# Patient Record
Sex: Male | Born: 1960 | Race: Black or African American | Hispanic: No | Smoking: Current every day smoker
Health system: Southern US, Community
[De-identification: ages and names within clinical notes are randomized; demographics above are authoritative.]

## PROBLEM LIST (undated history)

## (undated) DIAGNOSIS — M109 Gout, unspecified: Secondary | ICD-10-CM

## (undated) DIAGNOSIS — R7303 Prediabetes: Secondary | ICD-10-CM

## (undated) DIAGNOSIS — I10 Essential (primary) hypertension: Secondary | ICD-10-CM

## (undated) HISTORY — PX: COLONOSCOPY: SHX174

## (undated) HISTORY — PX: KNEE SURGERY: SHX244

---

## 2002-11-09 ENCOUNTER — Emergency Department (HOSPITAL_COMMUNITY): Admission: EM | Admit: 2002-11-09 | Discharge: 2002-11-09 | Payer: Self-pay | Admitting: Emergency Medicine

## 2003-08-09 ENCOUNTER — Encounter: Admission: RE | Admit: 2003-08-09 | Discharge: 2003-08-09 | Payer: Self-pay | Admitting: Internal Medicine

## 2003-09-01 ENCOUNTER — Encounter: Admission: RE | Admit: 2003-09-01 | Discharge: 2003-09-01 | Payer: Self-pay | Admitting: Internal Medicine

## 2004-02-06 ENCOUNTER — Emergency Department (HOSPITAL_COMMUNITY): Admission: EM | Admit: 2004-02-06 | Discharge: 2004-02-06 | Payer: Self-pay | Admitting: Emergency Medicine

## 2004-03-27 ENCOUNTER — Emergency Department (HOSPITAL_COMMUNITY): Admission: EM | Admit: 2004-03-27 | Discharge: 2004-03-27 | Payer: Self-pay | Admitting: Emergency Medicine

## 2006-04-15 ENCOUNTER — Emergency Department (HOSPITAL_COMMUNITY): Admission: EM | Admit: 2006-04-15 | Discharge: 2006-04-15 | Payer: Self-pay | Admitting: Emergency Medicine

## 2006-09-23 ENCOUNTER — Emergency Department (HOSPITAL_COMMUNITY): Admission: EM | Admit: 2006-09-23 | Discharge: 2006-09-23 | Payer: Self-pay | Admitting: Emergency Medicine

## 2007-03-11 ENCOUNTER — Encounter: Admission: RE | Admit: 2007-03-11 | Discharge: 2007-03-11 | Payer: Self-pay | Admitting: Occupational Medicine

## 2008-05-01 ENCOUNTER — Emergency Department (HOSPITAL_COMMUNITY): Admission: EM | Admit: 2008-05-01 | Discharge: 2008-05-01 | Payer: Self-pay | Admitting: Emergency Medicine

## 2008-09-15 ENCOUNTER — Ambulatory Visit: Payer: Self-pay | Admitting: Cardiology

## 2008-09-15 ENCOUNTER — Observation Stay (HOSPITAL_COMMUNITY): Admission: EM | Admit: 2008-09-15 | Discharge: 2008-09-16 | Payer: Self-pay | Admitting: Emergency Medicine

## 2008-09-27 ENCOUNTER — Emergency Department (HOSPITAL_COMMUNITY): Admission: EM | Admit: 2008-09-27 | Discharge: 2008-09-27 | Payer: Self-pay | Admitting: Emergency Medicine

## 2011-03-22 ENCOUNTER — Emergency Department (HOSPITAL_COMMUNITY): Payer: Self-pay

## 2011-03-22 ENCOUNTER — Observation Stay (HOSPITAL_COMMUNITY)
Admission: EM | Admit: 2011-03-22 | Discharge: 2011-03-23 | Disposition: A | Discharge status: Home / Self Care | Payer: Self-pay | Attending: Emergency Medicine | Admitting: Emergency Medicine

## 2011-03-22 DIAGNOSIS — I10 Essential (primary) hypertension: Secondary | ICD-10-CM | POA: Insufficient documentation

## 2011-03-22 DIAGNOSIS — R079 Chest pain, unspecified: Principal | ICD-10-CM | POA: Insufficient documentation

## 2011-03-22 LAB — POCT CARDIAC MARKERS
CKMB, poc: <1 ng/mL — ABNORMAL LOW (ref 1.0–8.0)
Myoglobin, poc: 40.1 ng/mL (ref 12–200)
Myoglobin, poc: 52.1 ng/mL (ref 12–200)
Myoglobin, poc: 66.8 ng/mL (ref 12–200)
Troponin i, poc: <0.05 ng/mL (ref 0.00–0.09)

## 2011-03-22 LAB — CBC
HCT: 44.3 % (ref 39.0–52.0)
MCH: 28.8 pg (ref 26.0–34.0)
MCHC: 33.4 g/dL (ref 30.0–36.0)
MCV: 86.2 fL (ref 78.0–100.0)
RDW: 14.5 % (ref 11.5–15.5)

## 2011-03-22 LAB — DIFFERENTIAL
Basophils Absolute: 0 10*3/uL (ref 0.0–0.1)
Eosinophils Relative: 2 % (ref 0–5)
Lymphocytes Relative: 33 % (ref 12–46)
Monocytes Absolute: 0.5 10*3/uL (ref 0.1–1.0)
Monocytes Relative: 6 % (ref 3–12)

## 2011-03-22 LAB — POCT I-STAT, CHEM 8
Creatinine, Ser: 1 mg/dL (ref 0.4–1.5)
Glucose, Bld: 100 mg/dL — ABNORMAL HIGH (ref 70–99)
Hemoglobin: 16 g/dL (ref 13.0–17.0)
Potassium: 4.2 mEq/L (ref 3.5–5.1)

## 2011-03-23 DIAGNOSIS — R072 Precordial pain: Secondary | ICD-10-CM

## 2011-04-23 ENCOUNTER — Inpatient Hospital Stay (INDEPENDENT_AMBULATORY_CARE_PROVIDER_SITE_OTHER)
Admission: RE | Admit: 2011-04-23 | Discharge: 2011-04-23 | Disposition: A | Discharge status: Home / Self Care | Payer: Self-pay | Source: Ambulatory Visit

## 2011-04-23 DIAGNOSIS — I10 Essential (primary) hypertension: Secondary | ICD-10-CM

## 2011-04-23 DIAGNOSIS — Z760 Encounter for issue of repeat prescription: Secondary | ICD-10-CM

## 2011-05-08 NOTE — H&P (Signed)
NAME:  Devin [name], Devin [name] NO.:  [id number]   MEDICAL RECORD NO.:  [id number]          PATIENT TYPE:  INP   LOCATION:  3735                         FACILITY:  [facility]   PHYSICIAN:  Vania Rea, M.D. DATE OF BIRTH:  10-06-1961   DATE OF ADMISSION:  09/14/2008  DATE OF DISCHARGE:                              HISTORY & PHYSICAL   PRIMARY CARE PHYSICIAN:  Unassigned.   CHIEF COMPLAINT:  Chest pain.   HISTORY OF PRESENT ILLNESS:  This is a 50 year old African American  gentleman with no medical followup and who does abuse crack cocaine who  presented to the emergency room complaining of chest pain on and off for  the past day.  The patient says he last used crack cocaine about 3 days  ago and has never felt a pain like this before.  The pain is described  as about an 8/10, central, does not radiate.  It is associated with  nausea and vomiting x1, no diaphoresis.  No dizziness.  It is sometimes  aggravated by breathing.  He came to the emergency room and received  aspirin and sublingual nitroglycerin and said it did produce a slight  improvement in the pain.   PAST MEDICAL HISTORY:  None.   MEDICATIONS:  None.   ALLERGIES:  No known drug allergies.   SOCIAL HISTORY:  Says he smokes about 3 to 4 cigarettes per day.  He  drinks two tall cans of beer per day.  Denies marijuana use.  Smokes  crack cocaine.  He is a Administrator.   FAMILY HISTORY:  Significant for diabetes.   REVIEW OF SYSTEMS:  Other than noted above, a 10 point review of systems  is unremarkable.   PHYSICAL EXAMINATION:  GENERAL:  Medium build, middle aged [place]  American gentleman lying on the stretcher in no acute distress at this  time.  VITAL SIGNS:  His temperature is 96.7,  pulse 98, respiratory rate 21,  blood pressure 124/77.  He is saturating 100% on 2L.  HEENT:  His pupils are round and equal.  Mucous membranes are pink and  anicteric.  He is not dehydrated.  NECK:  No cervical  lymphadenopathy or thyromegaly.  No jugular venous  distention.  CHEST:  Is clear to auscultation bilaterally.  CARDIOVASCULAR SYSTEM:  Regular rhythm without murmur.  ABDOMEN:  Soft and nontender.  There are no masses.  He has normal  abdominal bowel sounds.  EXTREMITIES:  Are without edema.  He has 2+ pulses bilaterally.  CENTRAL NERVOUS SYSTEM:  Cranial nerves II-XII are grossly intact and he  has no focal neurological deficits.   LABORATORY DATA:  His CBC is completely normal.  His hemoglobin is 13.7.  His serum chemistry is likewise completely normal.  His urinalysis is  similarly completely  normal.  His urine drug screen is positive for  cocaine.  His cardiac enzymes show total CK elevated at 283.  His CK-MB  is normal at 1.8. His troponin is 0.01.  Beta type naturetic peptide is  nondetectable.  His chest x-ray shows a tortuous aorta but is  otherwise  unremarkable.   ASSESSMENT:  1. Chest pain associated with crack cocaine use.  2. Polysubstance abuse, alcohol and cocaine.   PLAN:  Will admit this gentleman on chest pain rule out protocol.  Will  give him benzodiazepines to prevent any type of withdrawal syndrome and  will consult cardiology for possible stress testing.      Vania Rea, M.D.  Electronically Signed     LC/MEDQ  D:  09/15/2008  T:  09/15/2008  Job:  [id number]

## 2011-05-08 NOTE — Discharge Summary (Signed)
NAME:  Devin [name], Devin [name] NO.:  [id number]   MEDICAL RECORD NO.:  [id number]          PATIENT TYPE:  INP   LOCATION:  3735                         FACILITY:  [facility]   PHYSICIAN:  Marcellus Scott, MD     DATE OF BIRTH:  02-Jun-1961   DATE OF ADMISSION:  09/14/2008  DATE OF DISCHARGE:  09/16/2008                               DISCHARGE SUMMARY   PRIMARY CARE PHYSICIAN:  Unassigned but will followup with [facility],  Dr. Tressia Danas.   DISCHARGE DIAGNOSIS:  1. Atypical chest pain.  MI ruled out.  Negative stress test.  2. Gastroesophageal reflux disease.  3. Substance abuse.  4. Alcohol use.  5. Tobacco abuse.  6. Dyslipidemia.   DISCHARGE MEDICATIONS:  1. Prilosec over the counter 20 mg tablet one p.o. daily.  2. Mylanta 30 to 60 ml p.o. q.4-6 hourly p.r.n.  3. Multi vitamins one p.o. daily.   PROCEDURES:  1. Nuclear medicine stress test done today.  Impression; (a) no      reversible ischemia or infarction; (b) normal wall motions; (c)      left ventricular ejection fraction 58%.  2. Chest x-ray on the 22nd of September.  Impression; tortuous aorta.      No infiltrate or congestive heart failure.   PERTINENT LABS:  Cardiac panel cycle times four negative.  TSH times two  normal.  2.117 and 2.464 lipid panel with cholesterol 163, triglycerides  540, HDL 49, LDL unable to calculate secondary to triglyceride greater  than 400. Hepatic panel only remarkable for albumin of 3.2. Urine drug  screen positive for cocaine. Urinalysis not indicative of UTI and  negative for protein.  Blood alcohol level of 237.  CBC is with  hemoglobin 13.7, hematocrit 90.2, white blood cell 8.4, platelets 260.   CONSULTATIONS:  1. Dr. Corinda Gubler cardiology.   HOSPITAL COURSE & PATIENT DISPOSITION:  Please refer to the history and  physical note for initial admission details. In summary Devin [name] is a  pleasant 50 year old African American male with no significant medical  history but  with a history of substance abuse (crack cocaine), tobacco  abuse and alcohol use who presented to the Emergency Department with on  and off chest pain for a day.  He had used crack cocaine three days  prior to admission. He presented with 8/10 central non-radiating chest  pain with associated nausea and vomiting. Sometimes the pain apparently  increased on breathing.  He was then evaluated in the Emergency Room,  provided aspirin and sublingual nitroglycerine with some relief in the  pain.  He was thereby admitted for further evaluation.   1. Atypical chest pain.  The patient was admitted to telemetry.      Cardiac enzymes were cycled and negative.  The patient did not have      any acute EKG changes.  His chest pain was suggestive of GI      etiology. He indicated that he has been having heartburn symptoms      for some time now and in the hospital too whenever he ate something  he would have worsening of his burning, lower substernal chest      pain. This seemed to be relieved with Mylanta.  He indicates that      since admission his chest pain is much better. He has been      counseled regarding abstinence from polysubstance abuse.  He had      some risk factors including his tobacco abuse, crack cocaine abuse      and possible dyslipidemia.  He does not have any family history of      coronary artery disease.  Stress test was discussed with  [facility] and he has this done and it was negative for ischemia.  2. Gastroesophageal reflux disease.  The patient's symptomatology is      quite suggestive of same.  He is being placed on proton pump      inhibitors.  Consider an outpatient EGD if deemed necessary.  3. Substance abuse/crack cocaine.  Patient has been counseled      regarding abstinence and he indicates he will give all these things      up.  4. Alcohol use.  The patient did not demonstrate any alcohol      withdrawal symptoms.  5. Tobacco abuse.  6.  Dyslipidemia.  The patient has markedly elevated triglyceride      levels. This is in the context of elevated alcohol levels.  Do      consider repeating his fasting lipids after he has been abstinent      from alcohol for a few days.   The patient did not have a primary medical doctor and hence social work  consult was obtained and the patient has an appointment to see Dr.  Tressia Danas  at [facility] on the 5th of October 2009 at 2:30 p.m. for  eligibility.      Marcellus Scott, MD  Electronically Signed     AH/MEDQ  D:  09/16/2008  T:  09/16/2008  Job:  [id number]   cc:   Melvern Banker

## 2011-09-12 ENCOUNTER — Inpatient Hospital Stay (INDEPENDENT_AMBULATORY_CARE_PROVIDER_SITE_OTHER)
Admission: RE | Admit: 2011-09-12 | Discharge: 2011-09-12 | Disposition: A | Discharge status: Home / Self Care | Payer: Self-pay | Source: Ambulatory Visit | Attending: Emergency Medicine | Admitting: Emergency Medicine

## 2011-09-12 DIAGNOSIS — I10 Essential (primary) hypertension: Secondary | ICD-10-CM

## 2011-09-24 LAB — LIPID PANEL
Cholesterol: 163
Cholesterol: 169
HDL: 46
HDL: 49
Total CHOL/HDL Ratio: 3.7
Triglycerides: 561 — ABNORMAL HIGH

## 2011-09-24 LAB — CBC
HCT: 40.8
MCHC: 34.5
MCV: 89.8
Platelets: 260
Platelets: 323
RDW: 14.6
RDW: 14.5
WBC: 8.4
WBC: 8

## 2011-09-24 LAB — POCT I-STAT, CHEM 8
Calcium, Ion: 1.05 — ABNORMAL LOW
Chloride: 108
Creatinine, Ser: 1.2
Glucose, Bld: 78
Glucose, Bld: 92
HCT: 42
HCT: 43
Hemoglobin: 14.3
Hemoglobin: 14.6
Sodium: 142
TCO2: 20

## 2011-09-24 LAB — DIFFERENTIAL
Basophils Absolute: 0.1
Basophils Relative: 0
Eosinophils Absolute: 0.1
Eosinophils Absolute: 0.1
Eosinophils Relative: 1
Eosinophils Relative: 1
Lymphocytes Relative: 45
Lymphs Abs: 3.8
Lymphs Abs: 4
Neutrophils Relative %: 45

## 2011-09-24 LAB — ETHANOL: Alcohol, Ethyl (B): 237 — ABNORMAL HIGH

## 2011-09-24 LAB — URINALYSIS, ROUTINE W REFLEX MICROSCOPIC
Bilirubin Urine: NEGATIVE
Glucose, UA: NEGATIVE
Glucose, UA: NEGATIVE
Hgb urine dipstick: NEGATIVE
Ketones, ur: NEGATIVE
Nitrite: NEGATIVE
Protein, ur: NEGATIVE
Specific Gravity, Urine: 1.021
pH: 6
pH: 6

## 2011-09-24 LAB — HEPATIC FUNCTION PANEL
ALT: 25
AST: 30
Albumin: 3.1 — ABNORMAL LOW
Albumin: 3.2 — ABNORMAL LOW
Alkaline Phosphatase: 43
Bilirubin, Direct: <0.1
Total Bilirubin: 0.9
Total Bilirubin: 0.9
Total Protein: 6.2

## 2011-09-24 LAB — POCT CARDIAC MARKERS
CKMB, poc: 1.3
Myoglobin, poc: 138

## 2011-09-24 LAB — TSH
TSH: 2.117
TSH: 2.464

## 2011-09-24 LAB — CARDIAC PANEL(CRET KIN+CKTOT+MB+TROPI)
Relative Index: 0.6
Relative Index: 0.7
Total CK: 159
Total CK: 181
Troponin I: 0.01
Troponin I: <0.01

## 2011-09-24 LAB — COMPREHENSIVE METABOLIC PANEL
ALT: 28
Alkaline Phosphatase: 52
BUN: 9
CO2: 23
Chloride: 106
Glucose, Bld: 78
Potassium: 4
Sodium: 137
Total Bilirubin: 1

## 2011-09-24 LAB — APTT: aPTT: 29

## 2011-09-24 LAB — RAPID URINE DRUG SCREEN, HOSP PERFORMED
Amphetamines: NOT DETECTED
Benzodiazepines: NOT DETECTED
Cocaine: POSITIVE — AB

## 2011-09-24 LAB — URINE CULTURE
Colony Count: NO GROWTH
Culture: NO GROWTH

## 2011-09-24 LAB — CK TOTAL AND CKMB (NOT AT ARMC): CK, MB: 1.8

## 2011-09-24 LAB — PROTIME-INR
INR: 1
Prothrombin Time: 13.5

## 2011-09-24 LAB — TROPONIN I: Troponin I: 0.01

## 2012-04-01 ENCOUNTER — Encounter (HOSPITAL_COMMUNITY): Payer: Self-pay | Admitting: Emergency Medicine

## 2012-04-01 ENCOUNTER — Emergency Department (INDEPENDENT_AMBULATORY_CARE_PROVIDER_SITE_OTHER)
Admission: EM | Admit: 2012-04-01 | Discharge: 2012-04-01 | Disposition: A | Discharge status: Nursing Home (Includes ICF) | Payer: Self-pay | Source: Home / Self Care

## 2012-04-01 ENCOUNTER — Emergency Department (HOSPITAL_COMMUNITY)
Admission: EM | Admit: 2012-04-01 | Discharge: 2012-04-01 | Disposition: A | Discharge status: Home / Self Care | Payer: Self-pay | Attending: Emergency Medicine | Admitting: Emergency Medicine

## 2012-04-01 ENCOUNTER — Encounter (HOSPITAL_COMMUNITY): Payer: Self-pay | Admitting: *Deleted

## 2012-04-01 DIAGNOSIS — Z9889 Other specified postprocedural states: Secondary | ICD-10-CM | POA: Insufficient documentation

## 2012-04-01 DIAGNOSIS — R51 Headache: Secondary | ICD-10-CM | POA: Insufficient documentation

## 2012-04-01 DIAGNOSIS — Z79899 Other long term (current) drug therapy: Secondary | ICD-10-CM | POA: Insufficient documentation

## 2012-04-01 DIAGNOSIS — F17200 Nicotine dependence, unspecified, uncomplicated: Secondary | ICD-10-CM | POA: Insufficient documentation

## 2012-04-01 DIAGNOSIS — I10 Essential (primary) hypertension: Secondary | ICD-10-CM

## 2012-04-01 HISTORY — DX: Essential (primary) hypertension: I10

## 2012-04-01 MED ORDER — LISINOPRIL-HYDROCHLOROTHIAZIDE 10-12.5 MG PO TABS: 1.0000 | ORAL_TABLET | Freq: Every day | ORAL | Status: DC

## 2012-04-01 MED ORDER — KETOROLAC TROMETHAMINE 15 MG/ML IJ SOLN
15.0000 mg | Freq: Once | INTRAMUSCULAR | Status: AC
  Administered 2012-04-01: 15 mg via INTRAMUSCULAR
  Filled 2012-04-01: qty 1

## 2012-04-01 MED ORDER — OXYCODONE-ACETAMINOPHEN 5-325 MG PO TABS
2.0000 | ORAL_TABLET | Freq: Once | ORAL | Status: AC
  Administered 2012-04-01: 2 via ORAL
  Filled 2012-04-01: qty 2

## 2012-04-01 MED ORDER — HYDROCODONE-ACETAMINOPHEN 5-325 MG PO TABS
1.0000 | ORAL_TABLET | Freq: Once | ORAL | Status: AC
  Administered 2012-04-01: 1 via ORAL

## 2012-04-01 MED ORDER — HYDROCODONE-ACETAMINOPHEN 5-325 MG PO TABS
ORAL_TABLET | ORAL | Status: AC
  Filled 2012-04-01: qty 1

## 2012-04-01 NOTE — ED Notes (Signed)
Patient was sent from [facility] for further eval of headache and htn.  He states he has had sx for 3 days.   Patient has throbbing headache when he stands up

## 2012-04-01 NOTE — ED Provider Notes (Signed)
Medical screening examination/treatment/procedure(s) were performed by non-physician practitioner and as supervising physician I was immediately available for consultation/collaboration.  LANEY,RONNIE   Wadie Liew B Laney, MD 04/01/12 1742 

## 2012-04-01 NOTE — ED Provider Notes (Signed)
History    Devin [name] with HA. Referred from UC for further eval. Gradual onset about 3d ago. Cant remember what was doing at onset. Denies trauma. Constant and without radiation. Throbs. No appreciable exacerbating or relieving factors. No fever or chills. Denies hx of similar HA. No n/v. No acute visual changes. No numbness, tingling or loss of strength.    CSN: [id number]  Arrival date & time 04/01/12  1518   First MD Initiated Contact with Patient 04/01/12 1822      Chief Complaint  Patient presents with  . Headache  . Hypertension    (Consider location/radiation/quality/duration/timing/severity/associated sxs/prior treatment) HPI  Past Medical History  Diagnosis Date  . Hypertension     Past Surgical History  Procedure Date  . Knee surgery     No family history on file.  History  Substance Use Topics  . Smoking status: Current Everyday Smoker  . Smokeless tobacco: Not on file  . Alcohol Use: Yes      Review of Systems   Review of symptoms negative unless otherwise noted in HPI.   Allergies  Review of patient's allergies indicates no known allergies.  Home Medications   Current Outpatient Rx  Name Route Sig Dispense Refill  . LISINOPRIL-HYDROCHLOROTHIAZIDE 10-12.5 MG PO TABS Oral Take 1 tablet by mouth daily.      BP 156/107  Pulse 88  Temp(Src) 98.3 F (36.8 C) (Oral)  Resp 16  Ht 6\' 2"  (1.88 m)  Wt 205 lb (92.987 kg)  BMI 26.32 kg/m2  SpO2 97%  Physical Exam  Nursing note and vitals reviewed. Constitutional: He is oriented to person, place, and time. He appears well-developed and well-nourished. No distress.  HENT:  Head: Normocephalic and atraumatic.  Right Ear: External ear normal.  Left Ear: External ear normal.  Eyes: Conjunctivae are normal. Pupils are equal, round, and reactive to light. Right eye exhibits no discharge. Left eye exhibits no discharge.  Neck: Normal range of motion. Neck supple.  Cardiovascular: Normal rate, regular rhythm  and normal heart sounds.  Exam reveals no gallop and no friction rub.   No murmur heard. Pulmonary/Chest: Effort normal and breath sounds normal. No respiratory distress.  Abdominal: Soft. He exhibits no distension. There is no tenderness.  Musculoskeletal: He exhibits no edema and no tenderness.  Lymphadenopathy:    He has no cervical adenopathy.  Neurological: He is alert and oriented to person, place, and time. No cranial nerve deficit. He exhibits normal muscle tone. Coordination normal.       Good finger to nose and heel to shin testing b/l . steady gait.  Skin: Skin is warm and dry. He is not diaphoretic.  Psychiatric: He has a normal mood and affect. His behavior is normal. Thought content normal.    ED Course  Procedures (including critical care time)  Labs Reviewed - No data to display No results found.   1. Headache   2. Hypertension       MDM  Devin [name] with HA. Suspect primary HA. Consider emergent secondary causes such as bleed, infectious or mass but doubt. There is no history of trauma. Pt has a nonfocal neurological exam. Afebrile and neck supple. No use of blood thinning medication. Consider ocular etiology such as acute angle closure glaucoma but doubt. Pt is hypertensive but doubt hypertensive emergency. Pt denies acute change in visual acuity and eye exam unremarkable. Doubt temporal arteritis given age, no temporal tenderness and temporal artery pulsations palpable. Doubt CO poisoning. No contacts with similar  symptoms. Doubt venous thrombosis. Doubt carotid or vertebral arteries dissection. Symptoms improved with meds. Feel that can be safely discharged, but strict return precautions discussed. Outpt fu.         Raeford Razor, MD 04/10/12 [phone]

## 2012-04-01 NOTE — ED Notes (Signed)
The pt  Was sent here from [facility] with a headache for several days.  He was given a ibu at ucc at 1300. His headache is not quite as bad now.  Alert  Steady gait

## 2012-04-01 NOTE — ED Notes (Signed)
Patient given copy of discharge paperwork; went over discharge instructions with patient.  Patient instructed to take blood pressure medication as directed, to follow up with a primary care physician or use one of the referrals, and to return to the ED for new, worsening, or concerning symptoms.

## 2012-04-01 NOTE — ED Notes (Signed)
The pt has not taken his bp med for over a month

## 2012-04-01 NOTE — ED Notes (Signed)
Received bedside report from [place], [place].  Patient currently resting quietly in bed; no respiratory or acute distress noted.  Patient updated on plan of care; informed patient that we are currently waiting on Toradol from pharmacy.  Patient has no other questions or concerns; will continue to monitor.

## 2012-04-01 NOTE — Discharge Instructions (Signed)
General Headache, Without Cause A general headache has no specific cause. These headaches are not life-threatening. They will not lead to other types of headaches. HOME CARE   Make and keep follow-up visits with your doctor.   Only take medicine as told by your doctor.   Try to relax, get a massage, or use your thoughts to control your body (biofeedback).   Apply cold or heat to the head and neck. Apply 3 or 4 times a day or as needed.  Finding out the results of your test Ask when your test results will be ready. Make sure you get your test results. GET HELP RIGHT AWAY IF:   You have problems with medicine.   Your medicine does not help relieve pain.   Your headache changes or becomes worse.   You feel sick to your stomach (nauseous) or throw up (vomit).   You have a temperature by mouth above 102 F (38.9 C), not controlled by medicine.   Your have a stiff neck.   You have vision loss.   You have muscle weakness.   You lose control of your muscles.   You lose balance or have trouble walking.   You feel like you are going to pass out (faint).  MAKE SURE YOU:   Understand these instructions.   Will watch this condition.  Will get help right away if you are not doing well or get worse. Arterial Hypertension Arterial hypertension (high blood pressure) is a condition of elevated pressure in your blood vessels. Hypertension over a long period of time is a risk factor for strokes, heart attacks, and heart failure. It is also the leading cause of kidney (renal) failure.  CAUSES  In Adults -- Over 90% of all hypertension has no known cause. This is called essential or primary hypertension. In the other 10% of people with hypertension, the increase in blood pressure is caused by another disorder. This is called secondary hypertension. Important causes of secondary hypertension are:  Heavy alcohol use.  Obstructive sleep apnea.  Hyperaldosterosim (Conn's syndrome).  Steroid  use.  Chronic kidney failure.  Hyperparathyroidism.  Medications.  Renal artery stenosis.  Pheochromocytoma.  Cushing's disease.  Coarctation of the aorta.  Scleroderma renal crisis.  Licorice (in excessive amounts).  Drugs (cocaine, methamphetamine).  Your caregiver can explain any items above that apply to you. In Children -- Secondary hypertension is more common and should always be considered.  Pregnancy -- Few women of childbearing age have high blood pressure. However, up to 10% of them develop hypertension of pregnancy. Generally, this will not harm the woman. It may be a sign of 3 complications of pregnancy: preeclampsia, HELLP syndrome, and eclampsia. Follow up and control with medication is necessary.  SYMPTOMS  This condition normally does not produce any noticeable symptoms. It is usually found during a routine exam.  Malignant hypertension is a late problem of high blood pressure. It may have the following symptoms:  Headaches.  Blurred vision.  End-organ damage (this means your kidneys, heart, lungs, and other organs are being damaged).  Stressful situations can increase the blood pressure. If a person with normal blood pressure has their blood pressure go up while being seen by their caregiver, this is often termed "white coat hypertension." Its importance is not known. It may be related with eventually developing hypertension or complications of hypertension.  Hypertension is often confused with mental tension, stress, and anxiety.  DIAGNOSIS  The diagnosis is made by 3 separate blood pressure  measurements. They are taken at least 1 week apart from each other. If there is organ damage from hypertension, the diagnosis may be made without repeat measurements. Hypertension is usually identified by having blood pressure readings: Above 140/90 mmHg measured in both arms, at 3 separate times, over a couple weeks.  Over 130/80 mmHg should be considered a risk factor and may require  treatment in patients with diabetes.  Blood pressure readings over 120/80 mmHg are called "pre-hypertension" even in non-diabetic patients. To get a true blood pressure measurement, use the following guidelines. Be aware of the factors that can alter blood pressure readings. Take measurements at least 1 hour after caffeine.  Take measurements 30 minutes after smoking and without any stress. This is another reason to quit smoking - it raises your blood pressure.  Use a proper cuff size. Ask your caregiver if you are not sure about your cuff size.  Most home blood pressure cuffs are automatic. They will measure systolic and diastolic pressures. The systolic pressure is the pressure reading at the start of sounds. Diastolic pressure is the pressure at which the sounds disappear. If you are elderly, measure pressures in multiple postures. Try sitting, lying or standing.  Sit at rest for a minimum of 5 minutes before taking measurements.  You should not be on any medications like decongestants. These are found in many cold medications.  Record your blood pressure readings and review them with your caregiver.  If you have hypertension: Your caregiver may do tests to be sure you do not have secondary hypertension (see "causes" above).  Your caregiver may also look for signs of metabolic syndrome. This is also called Syndrome X or Insulin Resistance Syndrome. You may have this syndrome if you have type 2 diabetes, abdominal obesity, and abnormal blood lipids in addition to hypertension.  Your caregiver will take your medical and family history and perform a physical exam.  Diagnostic tests may include blood tests (for glucose, cholesterol, potassium, and kidney function), a urinalysis, or an EKG. Other tests may also be necessary depending on your condition.  PREVENTION  There are important lifestyle issues that you can adopt to reduce your chance of developing hypertension: Maintain a normal weight.    Limit the amount of salt (sodium) in your diet.  Exercise often.  Limit alcohol intake.  Get enough potassium in your diet. Discuss specific advice with your caregiver.  Follow a DASH diet (dietary approaches to stop hypertension). This diet is rich in fruits, vegetables, and low-fat dairy products, and avoids certain fats.  PROGNOSIS  Essential hypertension cannot be cured. Lifestyle changes and medical treatment can lower blood pressure and reduce complications. The prognosis of secondary hypertension depends on the underlying cause. Many people whose hypertension is controlled with medicine or lifestyle changes can live a normal, healthy life.  RISKS AND COMPLICATIONS  While high blood pressure alone is not an illness, it often requires treatment due to its short- and long-term effects on many organs. Hypertension increases your risk for: CVAs or strokes (cerebrovascular accident).  Heart failure due to chronically high blood pressure (hypertensive cardiomyopathy).  Heart attack (myocardial infarction).  Damage to the retina (hypertensive retinopathy).  Kidney failure (hypertensive nephropathy).  Your caregiver can explain list items above that apply to you. Treatment of hypertension can significantly reduce the risk of complications. TREATMENT  For overweight patients, weight loss and regular exercise are recommended. Physical fitness lowers blood pressure.  Mild hypertension is usually treated with diet and exercise. A  diet rich in fruits and vegetables, fat-free dairy products, and foods low in fat and salt (sodium) can help lower blood pressure. Decreasing salt intake decreases blood pressure in a 1/3 of people.  Stop smoking if you are a smoker.  The steps above are highly effective in reducing blood pressure. While these actions are easy to suggest, they are difficult to achieve. Most patients with moderate or severe hypertension end up requiring medications to bring their blood  pressure down to a normal level. There are several classes of medications for treatment. Blood pressure pills (antihypertensives) will lower blood pressure by their different actions. Lowering the blood pressure by 10 mmHg may decrease the risk of complications by as much as 25%. The goal of treatment is effective blood pressure control. This will reduce your risk for complications. Your caregiver will help you determine the best treatment for you according to your lifestyle. What is excellent treatment for one person, may not be for you. HOME CARE INSTRUCTIONS  Do not smoke.  Follow the lifestyle changes outlined in the "Prevention" section.  If you are on medications, follow the directions carefully. Blood pressure medications must be taken as prescribed. Skipping doses reduces their benefit. It also puts you at risk for problems.  Follow up with your caregiver, as directed.  If you are asked to monitor your blood pressure at home, follow the guidelines in the "Diagnosis" section above.  SEEK MEDICAL CARE IF:  You think you are having medication side effects.  You have recurrent headaches or lightheadedness.  You have swelling in your ankles.  You have trouble with your vision.  SEEK IMMEDIATE MEDICAL CARE IF:  You have sudden onset of chest pain or pressure, difficulty breathing, or other symptoms of a heart attack.  You have a severe headache.  You have symptoms of a stroke (such as sudden weakness, difficulty speaking, difficulty walking).  MAKE SURE YOU:  Understand these instructions.  Will watch your condition.  Will get help right away if you are not doing well or get worse.  Document Released: 12/10/2005 Document Revised: 11/29/2011 Document Reviewed: 07/10/2007  [facility] Patient Information 2012 [place], [place].  RESOURCE GUIDE  Dental Problems  Patients with Medicaid: [facility] [phone] [address]                                            [phone] [address] Phone:  [phone]                                                  Phone:  [phone]  If unable to pay or uninsured, contact:  [facility] or [facility]. to become qualified for the adult dental clinic.  Chronic Pain Problems Contact [facility]  [phone] Patients need to be referred by their primary care doctor.  Insufficient Money for Medicine Contact [employer]:  call "211" or [facility] [phone].  No Primary Care Doctor Call [facility]  4174032284 Other agencies that provide inexpensive medical care    [facility]  [phone]  [facility]  [phone]    [facility]  [phone]    [facility]  [phone]    [facility]  [phone]    [facility]  [phone]  Psychological Services [facility] Behavioral Health  [phone] [facility]  [phone] [facility] Mental Health   [phone] (emergency services [phone])  Substance Abuse Resources [facility]  [phone] [facility] [phone] [facility] [phone] Floydene Flock [phone] Residential & Outpatient Substance Abuse Program  [phone]  Abuse/Neglect [facility] Child Abuse Hotline 786-680-1651 [facility] Child Abuse Hotline [phone] (After Hours)  Emergency Shelter [facility] [phone]  Maternity Homes [facility] [place] [facility] [phone] [facility] [phone]  MRSA Hotline #:   872-653-2143    [facility] Resources  [facility] [place]     [employer]                          [facility] Dept. [address] [place]                       [address]      [address]  Blondell Reveal Phone:  [phone]                                    Phone:  [phone]                 Phone:  [phone]  [facility] Mental Health Phone:  [phone]  [facility] Child Abuse Hotline [phone] [phone] (After Hours)   Document Released: 09/18/2008 Document Revised: 11/29/2011 Document Reviewed: 09/18/2008 [facility] Patient Information 2012 [place], [place].

## 2012-04-01 NOTE — ED Notes (Signed)
Patient has a headache and headache started 3 days ago, pain worse with standing.  Reports dizziness with standing.   Ran out of blood pressure medicine approx one month ago.

## 2012-04-01 NOTE — ED Provider Notes (Signed)
History     CSN: [id number]  Arrival date & time 04/01/12  1138   None     Chief Complaint  Patient presents with  . Headache    (Consider location/radiation/quality/duration/timing/severity/associated sxs/prior treatment) HPI Comments: Patient presents today with complaints of a right-sided throbbing headache. Headache began 3 days ago. It is worse with standing. He also develops dizziness when he is standing. He denies nausea, vomiting, visual changes, photophobia, phonophobia, numbness or tingling. He also denies trauma. He states he has never had a headache like this previously. He has not taken anything for his discomfort. He has a history of hypertension and has been out of his blood pressure medication for approximately one month.   Past Medical History  Diagnosis Date  . Hypertension     Past Surgical History  Procedure Date  . Knee surgery     History reviewed. No pertinent family history.  History  Substance Use Topics  . Smoking status: Current Everyday Smoker  . Smokeless tobacco: Not on file  . Alcohol Use: Yes      Review of Systems  Constitutional: Negative for fever, chills and fatigue.  HENT: Negative for ear pain, congestion, sore throat, rhinorrhea, sneezing, postnasal drip and sinus pressure.   Respiratory: Negative for cough.   Cardiovascular: Negative for chest pain.  Gastrointestinal: Negative for nausea and vomiting.  Neurological: Positive for dizziness and headaches. Negative for weakness and numbness.    Allergies  Review of patient's allergies indicates no known allergies.  Home Medications   Current Outpatient Rx  Name Route Sig Dispense Refill  . OVER THE COUNTER MEDICATION  Cannot remember name of blood pressure medicine, says prescribed here.  Out of medicine for a month.  09/2012record: lisinopril-hctz 10-12.5mg  Qday      BP 144/94  Pulse 94  Temp(Src) 98.3 F (36.8 C) (Oral)  Resp 12  SpO2 100%  Physical Exam  Nursing  note and vitals reviewed. Constitutional: He appears well-developed and well-nourished. No distress.  HENT:  Head: Normocephalic and atraumatic.  Right Ear: Tympanic membrane, external ear and ear canal normal.  Left Ear: Tympanic membrane, external ear and ear canal normal.  Nose: Nose normal.  Mouth/Throat: Uvula is midline, oropharynx is clear and moist and mucous membranes are normal. No oropharyngeal exudate, posterior oropharyngeal edema or posterior oropharyngeal erythema.  Eyes: Conjunctivae, EOM and lids are normal. Pupils are equal, round, and reactive to light.  Fundoscopic exam:      The right eye shows no hemorrhage and no papilledema.       The left eye shows no hemorrhage and no papilledema.  Neck: Neck supple.  Cardiovascular: Normal rate, regular rhythm and normal heart sounds.   Pulmonary/Chest: Effort normal and breath sounds normal. No respiratory distress.  Lymphadenopathy:    He has no cervical adenopathy.  Neurological: He is alert. He has normal strength. No cranial nerve deficit.  Reflex Scores:      Bicep reflexes are 2+ on the right side and 2+ on the left side. Skin: Skin is warm and dry.  Psychiatric: He has a normal mood and affect.    ED Course  Procedures (including critical care time)  Labs Reviewed - No data to display No results found.   1. Headache   2. Hypertension       MDM  Pt transferred to [facility], [place] 04/01/12 1427

## 2013-02-09 ENCOUNTER — Emergency Department (HOSPITAL_COMMUNITY)
Admission: EM | Admit: 2013-02-09 | Discharge: 2013-02-09 | Discharge status: Against Medical Advice | Payer: Self-pay | Attending: Emergency Medicine | Admitting: Emergency Medicine

## 2013-02-09 ENCOUNTER — Other Ambulatory Visit: Payer: Self-pay

## 2013-02-09 ENCOUNTER — Emergency Department (HOSPITAL_COMMUNITY): Payer: Self-pay

## 2013-02-09 ENCOUNTER — Encounter (HOSPITAL_COMMUNITY): Payer: Self-pay | Admitting: Emergency Medicine

## 2013-02-09 DIAGNOSIS — R0602 Shortness of breath: Secondary | ICD-10-CM | POA: Insufficient documentation

## 2013-02-09 DIAGNOSIS — Z79899 Other long term (current) drug therapy: Secondary | ICD-10-CM | POA: Insufficient documentation

## 2013-02-09 DIAGNOSIS — R079 Chest pain, unspecified: Secondary | ICD-10-CM

## 2013-02-09 DIAGNOSIS — I10 Essential (primary) hypertension: Secondary | ICD-10-CM | POA: Insufficient documentation

## 2013-02-09 DIAGNOSIS — R0789 Other chest pain: Secondary | ICD-10-CM | POA: Insufficient documentation

## 2013-02-09 DIAGNOSIS — F17200 Nicotine dependence, unspecified, uncomplicated: Secondary | ICD-10-CM | POA: Insufficient documentation

## 2013-02-09 LAB — CBC
HCT: 43.9 % (ref 39.0–52.0)
MCH: 28.5 pg (ref 26.0–34.0)
MCV: 86.8 fL (ref 78.0–100.0)
Platelets: 250 10*3/uL (ref 150–400)
RBC: 5.06 MIL/uL (ref 4.22–5.81)
RDW: 14.8 % (ref 11.5–15.5)
WBC: 7.6 10*3/uL (ref 4.0–10.5)

## 2013-02-09 LAB — COMPREHENSIVE METABOLIC PANEL
AST: 22 U/L (ref 0–37)
BUN: 11 mg/dL (ref 6–23)
CO2: 26 mEq/L (ref 19–32)
Calcium: 9.3 mg/dL (ref 8.4–10.5)
Chloride: 106 mEq/L (ref 96–112)
Creatinine, Ser: 0.81 mg/dL (ref 0.50–1.35)
GFR calc non Af Amer: >90 mL/min (ref >90)
Total Bilirubin: 0.2 mg/dL — ABNORMAL LOW (ref 0.3–1.2)

## 2013-02-09 LAB — RAPID URINE DRUG SCREEN, HOSP PERFORMED
Benzodiazepines: NOT DETECTED
Cocaine: NOT DETECTED
Opiates: NOT DETECTED

## 2013-02-09 MED ORDER — LISINOPRIL 20 MG PO TABS
20.0000 mg | ORAL_TABLET | Freq: Once | ORAL | Status: AC
  Administered 2013-02-09: 20 mg via ORAL
  Filled 2013-02-09: qty 1

## 2013-02-09 MED ORDER — HYDROCODONE-ACETAMINOPHEN 5-325 MG PO TABS
2.0000 | ORAL_TABLET | Freq: Once | ORAL | Status: AC
  Administered 2013-02-09: 2 via ORAL
  Filled 2013-02-09: qty 2

## 2013-02-09 MED ORDER — FAMOTIDINE 20 MG PO TABS
20.0000 mg | ORAL_TABLET | Freq: Once | ORAL | Status: AC
  Administered 2013-02-09: 20 mg via ORAL
  Filled 2013-02-09: qty 1

## 2013-02-09 MED ORDER — HYDROCHLOROTHIAZIDE 12.5 MG PO CAPS
12.5000 mg | ORAL_CAPSULE | Freq: Once | ORAL | Status: AC
  Administered 2013-02-09: 12.5 mg via ORAL
  Filled 2013-02-09: qty 1

## 2013-02-09 MED ORDER — LISINOPRIL-HYDROCHLOROTHIAZIDE 20-12.5 MG PO TABS: 1.0000 | ORAL_TABLET | Freq: Every day | ORAL | Status: DC

## 2013-02-09 MED ORDER — GI COCKTAIL ~~LOC~~
30.0000 mL | Freq: Once | ORAL | Status: AC
  Administered 2013-02-09: 30 mL via ORAL
  Filled 2013-02-09: qty 30

## 2013-02-09 NOTE — ED Notes (Signed)
Pt states he is unable to wait any longer and have any more labs drawn. Dr. Arizona Constable aware. Pt medicated for HTN. Pt will sign out AMA.

## 2013-02-09 NOTE — ED Notes (Signed)
Dr. Denton Lank at bedside speaking with pt. Pt states he feels better and wants to leave AMA.

## 2013-02-09 NOTE — Progress Notes (Signed)
During [facility] ED visit referral to [facility] for community care liaison who spoke with pt to confirm no pcp, no coverage and [place] resident She discussed and provided written information for list of self pay [place] pcps and affordable care act marketplace enrollment information   

## 2013-02-09 NOTE — ED Provider Notes (Addendum)
History     CSN: [id number]  Arrival date & time 02/09/13  1202   First MD Initiated Contact with Patient 02/09/13 1206      Chief Complaint  Patient presents with  . Chest Pain  . Shortness of Breath    (Consider location/radiation/quality/duration/timing/severity/associated sxs/prior treatment) Patient is a 52 y.o. male presenting with chest pain and shortness of breath. The history is provided by the patient.  Chest Pain Associated symptoms: shortness of breath   Associated symptoms: no abdominal pain, no back pain, no cough, no fever and no headache   Shortness of Breath Associated symptoms: chest pain   Associated symptoms: no abdominal pain, no cough, no fever, no headaches, no neck pain and no rash   pt c/o mid cp onset today, constant, dull, non radiating. No associated nv, diaphoresis or sob. Pain is not pleuritic. At rest. No relation to position, activity or exertion. Denies cough or uri c/o. No hx gerd. Denies chest wall injury or strain. Smoker. Denies leg pain or swelling. No recent immobility, trauma, travel or surgery. No hx cad or family hx cad. No hx dvt or pe.      Past Medical History  Diagnosis Date  . Hypertension     Past Surgical History  Procedure Laterality Date  . Knee surgery      No family history on file.  History  Substance Use Topics  . Smoking status: Current Every Day Smoker  . Smokeless tobacco: Not on file  . Alcohol Use: Yes      Review of Systems  Constitutional: Negative for fever.  HENT: Negative for neck pain.   Eyes: Negative for redness.  Respiratory: Positive for shortness of breath. Negative for cough.   Cardiovascular: Positive for chest pain. Negative for leg swelling.  Gastrointestinal: Negative for abdominal pain.  Genitourinary: Negative for flank pain.  Musculoskeletal: Negative for back pain.  Skin: Negative for rash.  Neurological: Negative for headaches.  Hematological: Does not bruise/bleed easily.   Psychiatric/Behavioral: Negative for confusion.    Allergies  Review of patient's allergies indicates no known allergies.  Home Medications   Current Outpatient Rx  Name  Route  Sig  Dispense  Refill  . lisinopril-hydrochlorothiazide (PRINZIDE) 10-12.5 MG per tablet   Oral   Take 1 tablet by mouth daily.   30 tablet   3   . lisinopril-hydrochlorothiazide (PRINZIDE,ZESTORETIC) 10-12.5 MG per tablet   Oral   Take 1 tablet by mouth daily.           BP 166/112  Pulse 83  Temp(Src) 97.8 F (36.6 C)  SpO2 99%  Physical Exam  Nursing note and vitals reviewed. Constitutional: He is oriented to person, place, and time. He appears well-developed and well-nourished. No distress.  HENT:  Head: Atraumatic.  Mouth/Throat: Oropharynx is clear and moist.  Eyes: Conjunctivae are normal. No scleral icterus.  Neck: Neck supple. No tracheal deviation present.  Cardiovascular: Normal rate, regular rhythm, normal heart sounds and intact distal pulses.  Exam reveals no gallop and no friction rub.   No murmur heard. Pulmonary/Chest: Effort normal and breath sounds normal. No accessory muscle usage. No respiratory distress.  Abdominal: Soft. Bowel sounds are normal. He exhibits no distension and no mass. There is no tenderness. There is no rebound and no guarding.  Musculoskeletal: Normal range of motion. He exhibits no edema and no tenderness.  Neurological: He is alert and oriented to person, place, and time.  Skin: Skin is warm and dry.  Psychiatric: He has a normal mood and affect.    ED Course  Procedures (including critical care time)  Results for orders placed during the hospital encounter of 02/09/13  CBC      Result Value Range   WBC 7.6  4.0 - 10.5 K/uL   RBC 5.06  4.22 - 5.81 MIL/uL   Hemoglobin 14.4  13.0 - 17.0 g/dL   HCT 24.4  01.0 - 27.2 %   MCV 86.8  78.0 - 100.0 fL   MCH 28.5  26.0 - 34.0 pg   MCHC 32.8  30.0 - 36.0 g/dL   RDW 53.6  64.4 - 03.4 %   Platelets  250  150 - 400 K/uL  COMPREHENSIVE METABOLIC PANEL      Result Value Range   Sodium 141  135 - 145 mEq/L   Potassium 4.9  3.5 - 5.1 mEq/L   Chloride 106  96 - 112 mEq/L   CO2 26  19 - 32 mEq/L   Glucose, Bld 113 (*) 70 - 99 mg/dL   BUN 11  6 - 23 mg/dL   Creatinine, Ser 7.42  0.50 - 1.35 mg/dL   Calcium 9.3  8.4 - 59.5 mg/dL   Total Protein 7.1  6.0 - 8.3 g/dL   Albumin 3.7  3.5 - 5.2 g/dL   AST 22  0 - 37 U/L   ALT 23  0 - 53 U/L   Alkaline Phosphatase 66  39 - 117 U/L   Total Bilirubin 0.2 (*) 0.3 - 1.2 mg/dL   GFR calc non Af Amer >90  >90 mL/min   GFR calc Af Amer >90  >90 mL/min  URINE RAPID DRUG SCREEN (HOSP PERFORMED)      Result Value Range   Opiates NONE DETECTED  NONE DETECTED   Cocaine NONE DETECTED  NONE DETECTED   Benzodiazepines NONE DETECTED  NONE DETECTED   Amphetamines NONE DETECTED  NONE DETECTED   Tetrahydrocannabinol NONE DETECTED  NONE DETECTED   Barbiturates NONE DETECTED  NONE DETECTED  TROPONIN I      Result Value Range   Troponin I <0.30  <0.30 ng/mL   Dg Chest 2 View  02/09/2013  *RADIOLOGY REPORT*  Clinical Data: Chest pain and shortness of breath  CHEST - 2 VIEW  Comparison: March 22, 2011  Findings: The lungs are clear.  The heart size and pulmonary vascularity are normal.  No adenopathy.  No bone lesions.  No pneumothorax.  IMPRESSION: No abnormality noted.   Original Report Authenticated By: Bretta Bang, M.D.        MDM  Iv ns. Labs. Ecg. Cxr.  Pt given pepcid, gi cocktail and vicodin for symptom relief.   Date: 02/09/2013  Rate: 79  Rhythm: normal sinus rhythm  QRS Axis: left  Intervals: normal  ST/T Wave abnormalities: normal  Conduction Disutrbances:none  Narrative Interpretation:   Old EKG Reviewed: unchanged  Reviewed nursing notes and prior charts for additional history.    reviewed prior charts, prior stress test x 2 negative.   Symptoms present since am.  Will plan to get repeat troponin.  On recheck pt  asymptomatic w above meds.   If pt remains asymptomatic, second trop neg/not increasing, feel stable for d/c w close cardiology f/u 1-2 days - signed out to oncoming EDP, Hyacinth Meeker.    Pt states out of his lisinopril hctz x 1-2 months. States when was on tolerating very well, no adverse rxn and bp better controlled. Will give rx.  While waiting for completion workup, repeat trop, reassessment, pt left ED ama. Risks discussed w patient including heart attack, permanent heart damage, death - pt voices understanding, states he will follow up as instructed for cp and htn, but not willing to stay in ed for further evaluation or testing - left ED ama.      Suzi Roots, MD 02/09/13 1500  Suzi Roots, MD 02/09/13 1507  Suzi Roots, MD 02/09/13 [phone]

## 2013-02-09 NOTE — ED Notes (Signed)
Pt states he feels better after receiving medication for pain.

## 2013-02-09 NOTE — ED Notes (Signed)
States that sharp pain started in his left chest about 20-30 mins ago. Denies radiation. States that the pain was constant initially but now is intermittent.

## 2013-04-02 ENCOUNTER — Encounter (HOSPITAL_COMMUNITY): Payer: Self-pay | Admitting: Emergency Medicine

## 2013-04-02 ENCOUNTER — Emergency Department (HOSPITAL_COMMUNITY)
Admission: EM | Admit: 2013-04-02 | Discharge: 2013-04-02 | Disposition: A | Discharge status: Home / Self Care | Payer: Self-pay | Attending: Emergency Medicine | Admitting: Emergency Medicine

## 2013-04-02 ENCOUNTER — Emergency Department (HOSPITAL_COMMUNITY): Payer: Self-pay

## 2013-04-02 DIAGNOSIS — F17200 Nicotine dependence, unspecified, uncomplicated: Secondary | ICD-10-CM | POA: Insufficient documentation

## 2013-04-02 DIAGNOSIS — Z79899 Other long term (current) drug therapy: Secondary | ICD-10-CM | POA: Insufficient documentation

## 2013-04-02 DIAGNOSIS — I10 Essential (primary) hypertension: Secondary | ICD-10-CM | POA: Insufficient documentation

## 2013-04-02 DIAGNOSIS — M722 Plantar fascial fibromatosis: Secondary | ICD-10-CM | POA: Insufficient documentation

## 2013-04-02 MED ORDER — IBUPROFEN 400 MG PO TABS
800.0000 mg | ORAL_TABLET | Freq: Once | ORAL | Status: AC
  Administered 2013-04-02: 800 mg via ORAL
  Filled 2013-04-02: qty 2

## 2013-04-02 MED ORDER — OXYCODONE-ACETAMINOPHEN 5-325 MG PO TABS: ORAL_TABLET | ORAL | Status: DC

## 2013-04-02 MED ORDER — LISINOPRIL-HYDROCHLOROTHIAZIDE 20-12.5 MG PO TABS: 1.0000 | ORAL_TABLET | Freq: Every day | ORAL | Status: DC

## 2013-04-02 MED ORDER — OXYCODONE-ACETAMINOPHEN 5-325 MG PO TABS
2.0000 | ORAL_TABLET | Freq: Once | ORAL | Status: AC
  Administered 2013-04-02: 2 via ORAL
  Filled 2013-04-02: qty 2

## 2013-04-02 NOTE — ED Notes (Signed)
Pt c/o right foot pain x weeks worse this am with some swelling; pt denies obvious injury

## 2013-04-02 NOTE — ED Notes (Signed)
Pt reports right ankle pain beginning 3 days ago.  Pt states he woke this morning and pain so intense he couldn't walk on right foot.  Denies injury.  Pt states he took advil yesterday without relief.  Pt alert oriented X4

## 2013-04-02 NOTE — ED Provider Notes (Signed)
History     CSN: [id number]  Arrival date & time 04/02/13  4098   First MD Initiated Contact with Patient 04/02/13 [phone]      Chief Complaint  Patient presents with  . Foot Pain    (Consider location/radiation/quality/duration/timing/severity/associated sxs/prior treatment) HPI  Devin [name] is a 52 y.o. male complaining of exacerbation of chronic right foot pain over the last 2 days. Patient denies injury, states that the pain is severe, 9/10 it is exacerbated by weightbearing. Patient states the pain is much worse in the a.m. on taking his initial first steps. Pain is located at the heel and radiates towards the toes. Patient states that he has been out of his hypertension medication for the last 3 days.  Past Medical History  Diagnosis Date  . Hypertension     Past Surgical History  Procedure Laterality Date  . Knee surgery      History reviewed. No pertinent family history.  History  Substance Use Topics  . Smoking status: Current Every Day Smoker  . Smokeless tobacco: Not on file  . Alcohol Use: Yes      Review of Systems  Constitutional: Negative for fever.  Respiratory: Negative for shortness of breath.   Cardiovascular: Negative for chest pain.  Gastrointestinal: Negative for nausea, vomiting, abdominal pain and diarrhea.  All other systems reviewed and are negative.    Allergies  Review of patient's allergies indicates no known allergies.  Home Medications   Current Outpatient Rx  Name  Route  Sig  Dispense  Refill  . ibuprofen (ADVIL,MOTRIN) 200 MG tablet   Oral   Take 200 mg by mouth daily as needed for pain. For pain         . lisinopril-hydrochlorothiazide (PRINZIDE,ZESTORETIC) 20-12.5 MG per tablet   Oral   Take 1 tablet by mouth daily.   30 tablet   0   . lisinopril-hydrochlorothiazide (PRINZIDE) 20-12.5 MG per tablet   Oral   Take 1 tablet by mouth daily.   30 tablet   2   . oxyCODONE-acetaminophen (PERCOCET/ROXICET) 5-325 MG  per tablet      1 to 2 tabs PO q6hrs  PRN for pain   15 tablet   0     BP 163/108  Pulse 87  Temp(Src) 98.1 F (36.7 C) (Oral)  Resp 18  SpO2 95%  Physical Exam  Nursing note and vitals reviewed. Constitutional: He is oriented to person, place, and time. He appears well-developed and well-nourished. No distress.  HENT:  Head: Normocephalic.  Eyes: Conjunctivae and EOM are normal.  Cardiovascular: Normal rate.   Pulmonary/Chest: Effort normal. No stridor.  Musculoskeletal: Normal range of motion.       Feet:  Mild diffuse swelling to the foot, tenderness to palpation on the plantar side of foot at the heel. Neurovascularly intact. Skin shows no sign of infection.  Neurological: He is alert and oriented to person, place, and time.  Psychiatric: He has a normal mood and affect.    ED Course  Procedures (including critical care time)  Labs Reviewed - No data to display No results found.   1. Plantar fasciitis of right foot   2. Tobacco use disorder   3. Hypertension       MDM   Devin [name] is a 52 y.o. male right foot pain consistent with plantar fasciitis and elevated blood pressure.  Patient has no tenderness to palpation in the area of lucency as noted on the x-ray. I think  this is an artifact.    Patient states he has been out of his lisinopril hydrochlorothiazide for 3 days. I have stressed to him the importance of medication compliance with hypertension medication I have described to him hypertension as a silent killer and told him that it is the #1 cause of MI, stroke and kidney failure.   Filed Vitals:   04/02/13 0830  BP: 163/108  Pulse: 87  Temp: 98.1 F (36.7 C)  TempSrc: Oral  Resp: 18  SpO2: 95%     Pt verbalized understanding and agrees with care plan. Outpatient follow-up and return precautions given.    New Prescriptions   LISINOPRIL-HYDROCHLOROTHIAZIDE (PRINZIDE) 20-12.5 MG PER TABLET    Take 1 tablet by mouth daily.    OXYCODONE-ACETAMINOPHEN (PERCOCET/ROXICET) 5-325 MG PER TABLET    1 to 2 tabs PO q6hrs  PRN for pain           Wynetta Emery, PA-C 04/02/13 1558

## 2013-04-04 NOTE — ED Provider Notes (Signed)
Medical screening examination/treatment/procedure(s) were performed by non-physician practitioner and as supervising physician I was immediately available for consultation/collaboration.   Zaheer Wageman M Alfreda Hammad, DO 04/04/13 1148 

## 2013-08-28 ENCOUNTER — Encounter (HOSPITAL_COMMUNITY): Payer: Self-pay | Admitting: Emergency Medicine

## 2013-08-28 ENCOUNTER — Emergency Department (HOSPITAL_COMMUNITY)
Admission: EM | Admit: 2013-08-28 | Discharge: 2013-08-28 | Disposition: A | Discharge status: Home / Self Care | Payer: Self-pay | Attending: Emergency Medicine | Admitting: Emergency Medicine

## 2013-08-28 DIAGNOSIS — H9209 Otalgia, unspecified ear: Secondary | ICD-10-CM | POA: Insufficient documentation

## 2013-08-28 DIAGNOSIS — H9201 Otalgia, right ear: Secondary | ICD-10-CM

## 2013-08-28 DIAGNOSIS — M25579 Pain in unspecified ankle and joints of unspecified foot: Secondary | ICD-10-CM | POA: Insufficient documentation

## 2013-08-28 DIAGNOSIS — F17200 Nicotine dependence, unspecified, uncomplicated: Secondary | ICD-10-CM | POA: Insufficient documentation

## 2013-08-28 DIAGNOSIS — Z8639 Personal history of other endocrine, nutritional and metabolic disease: Secondary | ICD-10-CM | POA: Insufficient documentation

## 2013-08-28 DIAGNOSIS — Z862 Personal history of diseases of the blood and blood-forming organs and certain disorders involving the immune mechanism: Secondary | ICD-10-CM | POA: Insufficient documentation

## 2013-08-28 DIAGNOSIS — I10 Essential (primary) hypertension: Secondary | ICD-10-CM | POA: Insufficient documentation

## 2013-08-28 DIAGNOSIS — K006 Disturbances in tooth eruption: Secondary | ICD-10-CM | POA: Insufficient documentation

## 2013-08-28 DIAGNOSIS — R220 Localized swelling, mass and lump, head: Secondary | ICD-10-CM | POA: Insufficient documentation

## 2013-08-28 HISTORY — DX: Gout, unspecified: M10.9

## 2013-08-28 MED ORDER — HYDROCODONE-ACETAMINOPHEN 5-325 MG PO TABS
1.0000 | ORAL_TABLET | Freq: Once | ORAL | Status: AC
  Administered 2013-08-28: 1 via ORAL
  Filled 2013-08-28: qty 1

## 2013-08-28 MED ORDER — HYDROCODONE-ACETAMINOPHEN 5-325 MG PO TABS: 1.0000 | ORAL_TABLET | Freq: Four times a day (QID) | ORAL | Status: DC | PRN

## 2013-08-28 MED ORDER — PENICILLIN V POTASSIUM 500 MG PO TABS: 500.0000 mg | ORAL_TABLET | Freq: Four times a day (QID) | ORAL | Status: DC

## 2013-08-28 NOTE — ED Provider Notes (Addendum)
CSN: [id number]     Arrival date & time 08/28/13  1133 History   First MD Initiated Contact with Patient 08/28/13 1228     Chief Complaint  Patient presents with  . Otalgia  . Facial Swelling   (Consider location/radiation/quality/duration/timing/severity/associated sxs/prior Treatment) Patient is a 52 y.o. male presenting with ear pain. The history is provided by the patient.  Otalgia Associated symptoms: no abdominal pain, no fever, no headaches, no neck pain and no rash    patient with 3 to four-day history of right ear pain feels as if it's stopped up. And then starting yesterday got left-sided face swelling denies any toothache however patient says his teeth hurting. Patchy. No trouble swallowing no trouble breathing no fever no headache. Patient also with a mention of continued right ankle pain. This was evaluated in the past with x-rays that were negative. Patient states that maybe he has a history of gout. There is no pain associated with the facial swelling the ear pain is 5/10. Your pain is a pressure type ache nonradiating. Past Medical History  Diagnosis Date  . Hypertension   . Gout    Past Surgical History  Procedure Laterality Date  . Knee surgery     No family history on file. History  Substance Use Topics  . Smoking status: Current Every Day Smoker  . Smokeless tobacco: Not on file  . Alcohol Use: Yes    Review of Systems  Constitutional: Negative for fever.  HENT: Positive for ear pain. Negative for trouble swallowing, neck pain, neck stiffness and dental problem.   Eyes: Negative for pain, redness and visual disturbance.  Respiratory: Negative for shortness of breath.   Cardiovascular: Negative for chest pain.  Gastrointestinal: Negative for abdominal pain.  Genitourinary: Negative for dysuria.  Musculoskeletal: Positive for arthralgias. Negative for back pain.  Skin: Negative for rash.  Neurological: Negative for headaches.  Hematological: Does not  bruise/bleed easily.  Psychiatric/Behavioral: Negative for confusion.    Allergies  Review of patient's allergies indicates no known allergies.  Home Medications   Current Outpatient Rx  Name  Route  Sig  Dispense  Refill  . ibuprofen (ADVIL,MOTRIN) 200 MG tablet   Oral   Take 200 mg by mouth daily as needed for pain. For pain         . lisinopril-hydrochlorothiazide (PRINZIDE,ZESTORETIC) 20-12.5 MG per tablet   Oral   Take 1 tablet by mouth daily.   30 tablet   0   . HYDROcodone-acetaminophen (NORCO/VICODIN) 5-325 MG per tablet   Oral   Take 1-2 tablets by mouth every 6 (six) hours as needed for pain.   20 tablet   0   . penicillin v potassium (VEETID) 500 MG tablet   Oral   Take 1 tablet (500 mg total) by mouth 4 (four) times daily.   40 tablet   0    BP 157/105  Pulse 118  Temp(Src) 98.2 F (36.8 C) (Oral)  Resp 18  SpO2 99% Physical Exam  Constitutional: He is oriented to person, place, and time. He appears well-developed and well-nourished. No distress.  HENT:  Right Ear: External ear normal.  Left Ear: External ear normal.  Mouth/Throat: Oropharynx is clear and moist. No oropharyngeal exudate.  And very poor dentition swelling to the left side of the face the cheek and a little bit to the left side of the upper lip. Nontender to palpation no evidence of skin abscess. No cervical adenopathy. Oral pharynx is clear no swelling  Eyes: Conjunctivae and EOM are normal. Pupils are equal, round, and reactive to light.  Neck: Normal range of motion. Neck supple.  Cardiovascular: Normal rate and regular rhythm.   Pulmonary/Chest: Effort normal and breath sounds normal. No respiratory distress.  Abdominal: Soft. Bowel sounds are normal. There is no tenderness.  Musculoskeletal: Normal range of motion. He exhibits no edema and no tenderness.  Right ankle no sniffing swelling no erythema nontender to palpation good cap refill good range of motion. Same for the foot.   Lymphadenopathy:    He has no cervical adenopathy.  Neurological: He is alert and oriented to person, place, and time. No cranial nerve deficit. He exhibits normal muscle tone. Coordination normal.  Skin: Skin is warm. No rash noted.    ED Course  Procedures (including critical care time) Labs Review Labs Reviewed - No data to display Imaging Review No results found.  MDM   1. Ear pain, right   2. Left facial swelling   3. Hypertension    An interesting combination of right ear pain left facial swelling. The right ear is normal examination TM is completely normal. There may be a little bit of eustachian tube dysfunction. No evidence of your infection. The left facial swelling to be consistent with a bad tooth however patient has minimal teeth in the left upper part of his mandible however we'll treat as if it could be a tooth infection. Patient also probably has untreated hypertension noted that back in February when he was seen he was started on the antihypertensive meds but has not continued. Encourage patient to followup with the wellness clinic referral information provided. Patient is nontoxic no acute distress. No cervical adenopathy no significant swelling in the mouth. No trouble swallowing. No sinus pressure no significant pain to the left side of the face.  Shelda Jakes, MD 08/28/13 1312  Shelda Jakes, MD 08/28/13 [phone]

## 2013-08-28 NOTE — ED Notes (Signed)
Rt ear hurting and stopped up x 3-4 days woke up this am left side of face swelling denies tooth ach, insect bite pt breathing well  Able to swollow secreatiions well  And rt foot hurts

## 2013-08-28 NOTE — ED Notes (Signed)
Pt requesting pain medication prior to Dc because he has to take the bus home and wont be able to pick up prescriptions until this evening when his girlfriend gets home. VO received from  Dr. Deretha Emory

## 2015-04-14 ENCOUNTER — Emergency Department (HOSPITAL_COMMUNITY): Payer: Self-pay

## 2015-04-14 ENCOUNTER — Encounter (HOSPITAL_COMMUNITY): Payer: Self-pay | Admitting: Emergency Medicine

## 2015-04-14 ENCOUNTER — Emergency Department (HOSPITAL_COMMUNITY)
Admission: EM | Admit: 2015-04-14 | Discharge: 2015-04-15 | Disposition: A | Discharge status: Home / Self Care | Payer: Self-pay | Attending: Emergency Medicine | Admitting: Emergency Medicine

## 2015-04-14 DIAGNOSIS — I10 Essential (primary) hypertension: Secondary | ICD-10-CM | POA: Insufficient documentation

## 2015-04-14 DIAGNOSIS — R059 Cough, unspecified: Secondary | ICD-10-CM

## 2015-04-14 DIAGNOSIS — J40 Bronchitis, not specified as acute or chronic: Secondary | ICD-10-CM

## 2015-04-14 DIAGNOSIS — R05 Cough: Secondary | ICD-10-CM

## 2015-04-14 DIAGNOSIS — R0602 Shortness of breath: Secondary | ICD-10-CM

## 2015-04-14 DIAGNOSIS — Z720 Tobacco use: Secondary | ICD-10-CM | POA: Insufficient documentation

## 2015-04-14 DIAGNOSIS — J209 Acute bronchitis, unspecified: Secondary | ICD-10-CM

## 2015-04-14 DIAGNOSIS — J9801 Acute bronchospasm: Secondary | ICD-10-CM | POA: Insufficient documentation

## 2015-04-14 DIAGNOSIS — R079 Chest pain, unspecified: Secondary | ICD-10-CM

## 2015-04-14 DIAGNOSIS — Z8739 Personal history of other diseases of the musculoskeletal system and connective tissue: Secondary | ICD-10-CM | POA: Insufficient documentation

## 2015-04-14 DIAGNOSIS — Z79899 Other long term (current) drug therapy: Secondary | ICD-10-CM | POA: Insufficient documentation

## 2015-04-14 LAB — CBC
HCT: 42.7 % (ref 39.0–52.0)
Hemoglobin: 14.1 g/dL (ref 13.0–17.0)
MCH: 28.1 pg (ref 26.0–34.0)
MCHC: 33 g/dL (ref 30.0–36.0)
MCV: 85.1 fL (ref 78.0–100.0)
PLATELETS: 203 10*3/uL (ref 150–400)
RBC: 5.02 MIL/uL (ref 4.22–5.81)
RDW: 14.2 % (ref 11.5–15.5)
WBC: 6.4 10*3/uL (ref 4.0–10.5)

## 2015-04-14 LAB — I-STAT TROPONIN, ED: Troponin i, poc: 0.01 ng/mL (ref 0.00–0.08)

## 2015-04-14 MED ORDER — HYDROCODONE-ACETAMINOPHEN 5-325 MG PO TABS
1.0000 | ORAL_TABLET | Freq: Once | ORAL | Status: AC
  Administered 2015-04-14: 1 via ORAL
  Filled 2015-04-14: qty 1

## 2015-04-14 MED ORDER — PREDNISONE 20 MG PO TABS
60.0000 mg | ORAL_TABLET | Freq: Once | ORAL | Status: AC
  Administered 2015-04-14: 60 mg via ORAL
  Filled 2015-04-14: qty 3

## 2015-04-14 MED ORDER — ALBUTEROL SULFATE (2.5 MG/3ML) 0.083% IN NEBU
2.5000 mg | INHALATION_SOLUTION | RESPIRATORY_TRACT | Status: DC | PRN
  Administered 2015-04-14: 2.5 mg via RESPIRATORY_TRACT
  Filled 2015-04-14: qty 3

## 2015-04-14 MED ORDER — IBUPROFEN 800 MG PO TABS
800.0000 mg | ORAL_TABLET | Freq: Once | ORAL | Status: AC
  Administered 2015-04-14: 800 mg via ORAL
  Filled 2015-04-14: qty 1

## 2015-04-14 NOTE — ED Notes (Signed)
Pt. reports intermittent left/mid chest pain onset today with SOB , productive cough , nausea and diaphoresis .

## 2015-04-14 NOTE — ED Provider Notes (Signed)
CSN: [id number]     Arrival date & time 04/14/15  2238 History   First MD Initiated Contact with Patient 04/14/15 2253     Chief Complaint  Patient presents with  . Chest Pain      HPI  Patient presents for evaluation of an illness starting yesterday morning. Some body aches. Felt feverish yesterday. Cough starting this morning. Coughing point he has soreness in both ribs. No localizing chest pain. Nausea but no vomiting. No diarrhea. Mild diffuse bodyaches. He did not receive a flu vaccine. No sore throat. No neck stiffness.  Past Medical History  Diagnosis Date  . Hypertension   . Gout    Past Surgical History  Procedure Laterality Date  . Knee surgery     No family history on file. History  Substance Use Topics  . Smoking status: Current Every Day Smoker  . Smokeless tobacco: Not on file  . Alcohol Use: Yes    Review of Systems  Constitutional: Positive for fever and chills. Negative for diaphoresis, appetite change and fatigue.  HENT: Negative for mouth sores, sore throat and trouble swallowing.   Eyes: Negative for visual disturbance.  Respiratory: Positive for cough and shortness of breath. Negative for chest tightness and wheezing.   Cardiovascular: Negative for chest pain.  Gastrointestinal: Positive for nausea. Negative for vomiting, abdominal pain, diarrhea and abdominal distention.  Endocrine: Negative for polydipsia, polyphagia and polyuria.  Genitourinary: Negative for dysuria, frequency and hematuria.  Musculoskeletal: Positive for myalgias. Negative for gait problem.  Skin: Negative for color change, pallor and rash.  Neurological: Negative for dizziness, syncope, light-headedness and headaches.  Hematological: Does not bruise/bleed easily.  Psychiatric/Behavioral: Negative for behavioral problems and confusion.      Allergies  Review of patient's allergies indicates no known allergies.  Home Medications   Prior to Admission medications     Medication Sig Start Date End Date Taking? Authorizing Provider  lisinopril-hydrochlorothiazide (PRINZIDE,ZESTORETIC) 20-12.5 MG per tablet Take 1 tablet by mouth daily. 02/09/13  Yes Lajean Saver, MD  Pseudoephedrine HCl (SUDAFED PO) Take 1 tablet by mouth once.   Yes Historical Provider, MD  HYDROcodone-acetaminophen (NORCO/VICODIN) 5-325 MG per tablet Take 1-2 tablets by mouth every 6 (six) hours as needed for pain. Patient not taking: Reported on 04/14/2015 08/28/13   Fredia Sorrow, MD  penicillin v potassium (VEETID) 500 MG tablet Take 1 tablet (500 mg total) by mouth 4 (four) times daily. Patient not taking: Reported on 04/14/2015 08/28/13   Fredia Sorrow, MD   BP 143/97 mmHg  Pulse 97  Temp(Src) 100.4 F (38 C) (Oral)  Resp 16  Ht 6\' 2"  (1.88 m)  Wt 253 lb (114.76 kg)  BMI 32.47 kg/m2  SpO2 97% Physical Exam  Constitutional: He is oriented to person, place, and time. He appears well-developed and well-nourished. No distress.  HENT:  Head: Normocephalic.  Eyes: Conjunctivae are normal. Pupils are equal, round, and reactive to light. No scleral icterus.  Neck: Normal range of motion. Neck supple. No thyromegaly present.  Cardiovascular: Normal rate and regular rhythm.  Exam reveals no gallop and no friction rub.   No murmur heard. Pulmonary/Chest: Effort normal. No respiratory distress. He has wheezes. He has rales.    Abdominal: Soft. Bowel sounds are normal. He exhibits no distension. There is no tenderness. There is no rebound.  Musculoskeletal: Normal range of motion.  Neurological: He is alert and oriented to person, place, and time.  Skin: Skin is warm and dry. No rash noted.  Psychiatric: He has a normal mood and affect. His behavior is normal.    ED Course  Procedures (including critical care time) Labs Review Labs Reviewed  [place], ED    Imaging Review Dg Chest 2 View  04/14/2015   CLINICAL  DATA:  Chest pain, shortness of breath, fever, cough.  EXAM: CHEST  2 VIEW  COMPARISON:  02/09/2013, 03/22/2011  FINDINGS: Aortic tortuosity. Heart size upper normal to mildly enlarged, similar to prior. No confluent airspace opacity. No pleural effusion or pneumothorax. Mild multilevel degenerative change.  IMPRESSION: No radiographic evidence of active cardiopulmonary disease.   Electronically Signed   By: Carlos Levering M.D.   On: 04/14/2015 23:25     EKG Interpretation   Date/Time:  Thursday April 14 2015 22:42:54 EDT Ventricular Rate:  110 PR Interval:  166 QRS Duration: 86 QT Interval:  340 QTC Calculation: 460 R Axis:   -52 Text Interpretation:  Sinus tachycardia Left anterior fascicular block  Abnormal ECG Confirmed by Jeneen Rinks  MD, [place] ([id number]) on 04/14/2015 11:23:58 PM      MDM   Final diagnoses:  SOB (shortness of breath)  Chest pain    Patient without infiltrate on chest x-ray. Clinically has right lower lobe crackles and rales. Wheezing and mild prolongation. Low-grade temp 100.6. Troponin normal. EKG without acute or ischemic changes.  Plan:  Albuterol, prednisone, Levaquin, vicoden,  ibuprofen. Willl reassess.     Tanna Furry, MD 04/15/15 [phone]

## 2015-04-15 LAB — BASIC METABOLIC PANEL
ANION GAP: 10 (ref 5–15)
BUN: 12 mg/dL (ref 6–23)
CHLORIDE: 102 mmol/L (ref 96–112)
CO2: 22 mmol/L (ref 19–32)
Calcium: 8.7 mg/dL (ref 8.4–10.5)
Creatinine, Ser: 0.86 mg/dL (ref 0.50–1.35)
GFR calc non Af Amer: >90 mL/min (ref >90)
Glucose, Bld: 105 mg/dL — ABNORMAL HIGH (ref 70–99)
POTASSIUM: 3.8 mmol/L (ref 3.5–5.1)
SODIUM: 134 mmol/L — AB (ref 135–145)

## 2015-04-15 LAB — BRAIN NATRIURETIC PEPTIDE: B NATRIURETIC PEPTIDE 5: 15.8 pg/mL (ref 0.0–100.0)

## 2015-04-15 MED ORDER — LISINOPRIL-HYDROCHLOROTHIAZIDE 20-12.5 MG PO TABS: 1.0000 | ORAL_TABLET | Freq: Every day | ORAL | Status: DC

## 2015-04-15 MED ORDER — PREDNISONE 20 MG PO TABS: 20.0000 mg | ORAL_TABLET | Freq: Two times a day (BID) | ORAL | Status: DC

## 2015-04-15 MED ORDER — BENZONATATE 100 MG PO CAPS: 100.0000 mg | ORAL_CAPSULE | Freq: Three times a day (TID) | ORAL | Status: DC

## 2015-04-15 MED ORDER — ALBUTEROL SULFATE HFA 108 (90 BASE) MCG/ACT IN AERS: 1.0000 | INHALATION_SPRAY | Freq: Four times a day (QID) | RESPIRATORY_TRACT | Status: DC | PRN

## 2015-04-15 MED ORDER — LEVOFLOXACIN 500 MG PO TABS: 500.0000 mg | ORAL_TABLET | Freq: Every day | ORAL | Status: DC

## 2015-04-15 NOTE — ED Notes (Signed)
Pt verbalized understanding of d/c instructions and has no further questions.  

## 2015-04-15 NOTE — Discharge Instructions (Signed)
Acute Bronchitis Bronchitis is inflammation of the airways that extend from the windpipe into the lungs (bronchi). The inflammation often causes mucus to develop. This leads to a cough, which is the most common symptom of bronchitis.  In acute bronchitis, the condition usually develops suddenly and goes away over time, usually in a couple weeks. Smoking, allergies, and asthma can make bronchitis worse. Repeated episodes of bronchitis may cause further lung problems.  CAUSES Acute bronchitis is most often caused by the same virus that causes a cold. The virus can spread from person to person (contagious) through coughing, sneezing, and touching contaminated objects. SIGNS AND SYMPTOMS   Cough.   Fever.   Coughing up mucus.   Body aches.   Chest congestion.   Chills.   Shortness of breath.   Sore throat.  DIAGNOSIS  Acute bronchitis is usually diagnosed through a physical exam. Your health care provider will also ask you questions about your medical history. Tests, such as chest X-rays, are sometimes done to rule out other conditions.  TREATMENT  Acute bronchitis usually goes away in a couple weeks. Oftentimes, no medical treatment is necessary. Medicines are sometimes given for relief of fever or cough. Antibiotic medicines are usually not needed but may be prescribed in certain situations. In some cases, an inhaler may be recommended to help reduce shortness of breath and control the cough. A cool mist vaporizer may also be used to help thin bronchial secretions and make it easier to clear the chest.  HOME CARE INSTRUCTIONS  Get plenty of rest.   Drink enough fluids to keep your urine clear or pale yellow (unless you have a medical condition that requires fluid restriction). Increasing fluids may help thin your respiratory secretions (sputum) and reduce chest congestion, and it will prevent dehydration.   Take medicines only as directed by your health care provider.  If  you were prescribed an antibiotic medicine, finish it all even if you start to feel better.  Avoid smoking and secondhand smoke. Exposure to cigarette smoke or irritating chemicals will make bronchitis worse. If you are a smoker, consider using nicotine gum or skin patches to help control withdrawal symptoms. Quitting smoking will help your lungs heal faster.   Reduce the chances of another bout of acute bronchitis by washing your hands frequently, avoiding people with cold symptoms, and trying not to touch your hands to your mouth, nose, or eyes.   Keep all follow-up visits as directed by your health care provider.  SEEK MEDICAL CARE IF: Your symptoms do not improve after 1 week of treatment.  SEEK IMMEDIATE MEDICAL CARE IF:  You develop an increased fever or chills.   You have chest pain.   You have severe shortness of breath.  You have bloody sputum.   You develop dehydration.  You faint or repeatedly feel like you are going to pass out.  You develop repeated vomiting.  You develop a severe headache. MAKE SURE YOU:   Understand these instructions.  Will watch your condition.  Will get help right away if you are not doing well or get worse. Document Released: 01/17/2005 Document Revised: 04/26/2014 Document Reviewed: 06/02/2013 [facility] Patient Information 2015 [place], [place]. This information is not intended to replace advice given to you by your health care provider. Make sure you discuss any questions you have with your health care provider.  Cough, Adult  A cough is a reflex. It helps you clear your throat and airways. A cough can help heal your body.  A cough can last 2 or 3 weeks (acute) or may last more than 8 weeks (chronic). Some common causes of a cough can include an infection, allergy, or a cold. HOME CARE  Only take medicine as told by your doctor.  If given, take your medicines (antibiotics) as told. Finish them even if you start to feel better.  Use  a cold steam vaporizer or humidifier in your home. This can help loosen thick spit (secretions).  Sleep so you are almost sitting up (semi-upright). Use pillows to do this. This helps reduce coughing.  Rest as needed.  Stop smoking if you smoke. GET HELP RIGHT AWAY IF:  You have yellowish-white fluid (pus) in your thick spit.  Your cough gets worse.  Your medicine does not reduce coughing, and you are losing sleep.  You cough up blood.  You have trouble breathing.  Your pain gets worse and medicine does not help.  You have a fever. MAKE SURE YOU:   Understand these instructions.  Will watch your condition.  Will get help right away if you are not doing well or get worse. Document Released: 08/23/2011 Document Revised: 04/26/2014 Document Reviewed: 08/23/2011 [facility] Patient Information 2015 [place], [place]. This information is not intended to replace advice given to you by your health care provider. Make sure you discuss any questions you have with your health care provider.

## 2015-06-03 ENCOUNTER — Encounter (HOSPITAL_COMMUNITY): Payer: Self-pay | Admitting: Emergency Medicine

## 2015-06-03 ENCOUNTER — Emergency Department (HOSPITAL_COMMUNITY)
Admission: EM | Admit: 2015-06-03 | Discharge: 2015-06-04 | Disposition: A | Discharge status: Home / Self Care | Payer: Self-pay | Attending: Emergency Medicine | Admitting: Emergency Medicine

## 2015-06-03 DIAGNOSIS — M109 Gout, unspecified: Secondary | ICD-10-CM | POA: Insufficient documentation

## 2015-06-03 DIAGNOSIS — Z7951 Long term (current) use of inhaled steroids: Secondary | ICD-10-CM | POA: Insufficient documentation

## 2015-06-03 DIAGNOSIS — R220 Localized swelling, mass and lump, head: Secondary | ICD-10-CM | POA: Insufficient documentation

## 2015-06-03 DIAGNOSIS — I10 Essential (primary) hypertension: Secondary | ICD-10-CM | POA: Insufficient documentation

## 2015-06-03 DIAGNOSIS — T464X5A Adverse effect of angiotensin-converting-enzyme inhibitors, initial encounter: Secondary | ICD-10-CM | POA: Insufficient documentation

## 2015-06-03 DIAGNOSIS — Z720 Tobacco use: Secondary | ICD-10-CM | POA: Insufficient documentation

## 2015-06-03 DIAGNOSIS — Z79899 Other long term (current) drug therapy: Secondary | ICD-10-CM | POA: Insufficient documentation

## 2015-06-03 MED ORDER — FAMOTIDINE IN NACL 20-0.9 MG/50ML-% IV SOLN
20.0000 mg | Freq: Once | INTRAVENOUS | Status: AC
  Administered 2015-06-03: 20 mg via INTRAVENOUS

## 2015-06-03 MED ORDER — EPINEPHRINE HCL 1 MG/ML IJ SOLN: 1.0000 mg | Freq: Once | INTRAMUSCULAR | Status: DC

## 2015-06-03 MED ORDER — EPINEPHRINE HCL 1 MG/ML IJ SOLN: 0.3000 mg | Freq: Once | INTRAMUSCULAR | Status: DC

## 2015-06-03 MED ORDER — METHYLPREDNISOLONE SODIUM SUCC 125 MG IJ SOLR
INTRAMUSCULAR | Status: AC
  Filled 2015-06-03: qty 2

## 2015-06-03 MED ORDER — FENTANYL CITRATE (PF) 100 MCG/2ML IJ SOLN
INTRAMUSCULAR | Status: AC
  Filled 2015-06-03: qty 2

## 2015-06-03 MED ORDER — EPINEPHRINE 0.3 MG/0.3ML IJ SOAJ
0.3000 mg | Freq: Once | INTRAMUSCULAR | Status: AC
Start: 2015-06-03 — End: 2015-06-03
  Administered 2015-06-03: 0.3 mg via INTRAMUSCULAR

## 2015-06-03 MED ORDER — DIPHENHYDRAMINE HCL 50 MG/ML IJ SOLN
25.0000 mg | Freq: Once | INTRAMUSCULAR | Status: AC
  Administered 2015-06-03: 25 mg via INTRAVENOUS

## 2015-06-03 MED ORDER — FENTANYL CITRATE (PF) 100 MCG/2ML IJ SOLN
50.0000 ug | Freq: Once | INTRAMUSCULAR | Status: AC
  Administered 2015-06-03: 50 ug via INTRAVENOUS

## 2015-06-03 MED ORDER — DIPHENHYDRAMINE HCL 50 MG/ML IJ SOLN
INTRAMUSCULAR | Status: AC
  Filled 2015-06-03: qty 1

## 2015-06-03 MED ORDER — METHYLPREDNISOLONE SODIUM SUCC 125 MG IJ SOLR
125.0000 mg | Freq: Once | INTRAMUSCULAR | Status: AC
  Administered 2015-06-03: 125 mg via INTRAVENOUS

## 2015-06-03 MED ORDER — EPINEPHRINE HCL 0.1 MG/ML IJ SOSY: 0.3000 mg | PREFILLED_SYRINGE | Freq: Once | INTRAMUSCULAR | Status: DC

## 2015-06-03 NOTE — ED Notes (Signed)
Patient with left facial swelling and pain.  Patient does have some swelling noted to tongue and some shortness of breath.  Patient denies any exposure to allergen.

## 2015-06-03 NOTE — ED Provider Notes (Signed)
CSN: [id number]     Arrival date & time 06/03/15  2313 History  This chart was scribed for Varney Biles, MD by Peyton Bottoms, ED Scribe. This patient was seen in room TRABC/TRABC and the patient's care was started at 11:22 PM.   Chief Complaint  Patient presents with  . Facial Swelling   The history is provided by the patient. No language interpreter was used.   HPI Comments: Devin [name] is a 54 y.o. male with a PMHx of hypertension and gout, brought in by EMS, who presents to the Emergency Department complaining of moderate to severe, gradually worsening facial swelling onset 3 to 4 hours PTA. Patient reports more swelling to the left side of his face than right. He reports associated facial and dental pain. He denies trouble swallowing. He denies recent changes in diet or prior hx of similar symptoms. Patient states he is currently taking lisinopril medication.   Past Medical History  Diagnosis Date  . Hypertension   . Gout    Past Surgical History  Procedure Laterality Date  . Knee surgery     No family history on file. History  Substance Use Topics  . Smoking status: Current Every Day Smoker  . Smokeless tobacco: Not on file  . Alcohol Use: Yes   Review of Systems  HENT: Positive for dental problem and facial swelling. Negative for trouble swallowing.   All other systems reviewed and are negative.  Allergies  Review of patient's allergies indicates no known allergies.  Home Medications   Prior to Admission medications   Medication Sig Start Date End Date Taking? Authorizing Provider  acetaminophen (TYLENOL) 500 MG tablet Take 1,000 mg by mouth every 6 (six) hours as needed for mild pain.   Yes Historical Provider, MD  albuterol (PROVENTIL HFA;VENTOLIN HFA) 108 (90 BASE) MCG/ACT inhaler Inhale 1-2 puffs into the lungs every 6 (six) hours as needed for wheezing. 04/15/15  Yes Tanna Furry, MD  lisinopril-hydrochlorothiazide (PRINZIDE) 20-12.5 MG per tablet Take 1 tablet  by mouth daily. 04/15/15  Yes Tanna Furry, MD  amLODipine (NORVASC) 5 MG tablet Take 1 tablet (5 mg total) by mouth daily. 06/04/15   Varney Biles, MD  diphenhydrAMINE (BENADRYL) 25 mg capsule Take 1 capsule (25 mg total) by mouth every 6 (six) hours as needed for itching. 06/04/15   Varney Biles, MD  HYDROcodone-acetaminophen (NORCO/VICODIN) 5-325 MG per tablet Take 1-2 tablets by mouth every 6 (six) hours as needed for pain. Patient not taking: Reported on 04/14/2015 08/28/13   Fredia Sorrow, MD  predniSONE (DELTASONE) 50 MG tablet Take 1 tablet (50 mg total) by mouth daily. 06/04/15   Varney Biles, MD   Triage Vitals: BP 187/121 mmHg  Pulse 90  Temp(Src) 99 F (37.2 C) (Oral)  Resp 19  SpO2 99%  Physical Exam  Constitutional: He is oriented to person, place, and time. He appears well-developed and well-nourished. No distress.  HENT:  Head: Normocephalic and atraumatic.  Swelling of his lips. No tongue swelling appreciated. No evidence of dental infection. No gingival abscess appreciated. Mild swelling on his maxillary region left more than right.   Eyes: Conjunctivae and EOM are normal.  Neck: Neck supple. No tracheal deviation present.  Cardiovascular: Normal rate, regular rhythm and normal heart sounds.   Pulmonary/Chest: Effort normal and breath sounds normal. No respiratory distress.  No stridulous breath sounds  Musculoskeletal: Normal range of motion.  Neurological: He is alert and oriented to person, place, and time.  Skin: Skin is  warm and dry.  No rash appreciated over the upper or lower extremity or the torso.  Psychiatric: He has a normal mood and affect. His behavior is normal.  Nursing note and vitals reviewed.  ED Course  Procedures (including critical care time)  DIAGNOSTIC STUDIES: Oxygen Saturation is 99% on RA, normal by my interpretation.    COORDINATION OF CARE: 11:27 PM- Advised patient to discontinue Lisinopril medication. Will monitor patient for the  next 4 hours. Pt advised of plan for treatment and pt agrees.  Labs Review Labs Reviewed - No data to display  Imaging Review No results found.   EKG Interpretation None     MDM   Final diagnoses:  Adverse reaction to lisinopril, initial encounter    Pt comes in with cc of mouth swelling. His L sided of the face, and lips are swollen. Tongue and oral mucosa appears normal. Symptoms started 2-3 hours ago, slowly worsening. Epi IM and other acute meds provided. Pt observed in the ER for several hours, and had no deterioration. There is no evidence of dental or facial infection. Strict return precautions discucssed and pt asked to stop lisinopril, amlodipine as a substitute provided.  Varney Biles, MD 06/05/15 2320

## 2015-06-04 MED ORDER — PREDNISONE 50 MG PO TABS: 50.0000 mg | ORAL_TABLET | Freq: Every day | ORAL | Status: DC

## 2015-06-04 MED ORDER — AMLODIPINE BESYLATE 5 MG PO TABS: 5.0000 mg | ORAL_TABLET | Freq: Every day | ORAL | Status: DC

## 2015-06-04 MED ORDER — FENTANYL CITRATE (PF) 100 MCG/2ML IJ SOLN
50.0000 ug | Freq: Once | INTRAMUSCULAR | Status: AC
  Administered 2015-06-04: 50 ug via INTRAVENOUS

## 2015-06-04 MED ORDER — OXYCODONE-ACETAMINOPHEN 5-325 MG PO TABS
1.0000 | ORAL_TABLET | Freq: Once | ORAL | Status: AC
  Administered 2015-06-04: 1 via ORAL
  Filled 2015-06-04: qty 1

## 2015-06-04 MED ORDER — IBUPROFEN 800 MG PO TABS: 800.0000 mg | ORAL_TABLET | Freq: Once | ORAL | Status: DC

## 2015-06-04 MED ORDER — DIPHENHYDRAMINE HCL 25 MG PO CAPS: 25.0000 mg | ORAL_CAPSULE | Freq: Four times a day (QID) | ORAL | Status: DC | PRN

## 2015-06-04 NOTE — Discharge Instructions (Signed)
We saw you in the ER after you had the allergic reaction.  The reaction is severe, however, it appears to be in control and there is no increased swelling or any difficulty in breathing noted. The symptoms are due to LISINOPRIL - so stop that medicine effective immediately. SEE YOUR DOCTOR FOR DIFFERENT MEDICINE.  Please take the medications prescribed.  PLEASE RETURN TO THE ER IMMEDIATELY IN CASE YOU START HAVING WORSENING SWELLING, DIFFICULTY IN BREATHING ETC.    Drug Allergy Allergic reactions to medicines are common. Some allergic reactions are mild. A delayed type of drug allergy that occurs 1 week or more after exposure to a medicine or vaccine is called serum sickness. A life-threatening, sudden (acute) allergic reaction that involves the whole body is called anaphylaxis. CAUSES  "True" drug allergies occur when there is an allergic reaction to a medicine. This is caused by overactivity of the immune system. First, the body becomes sensitized. The immune system is triggered by your first exposure to the medicine. Following this first exposure, future exposure to the same medicine may be life-threatening. Almost any medicine can cause an allergic reaction. Common ones are:  Penicillin.  Sulfonamides (sulfa drugs).  Local anesthetics.  X-ray dyes that contain iodine. SYMPTOMS  Common symptoms of a minor allergic reaction are:  Swelling around the mouth.  An itchy red rash or hives.  Vomiting or diarrhea. Anaphylaxis can cause swelling of the mouth and throat. This makes it difficult to breathe and swallow. Severe reactions can be fatal within seconds, even after exposure to only a trace amount of the drug that causes the reaction. HOME CARE INSTRUCTIONS   If you are unsure of what caused your reaction, keep a diary of foods and medicines used. Include the symptoms that followed. Avoid anything that causes reactions.  You may want to follow up with an allergy specialist after  the reaction has cleared in order to be tested to confirm the allergy. It is important to confirm that your reaction is an allergy, not just a side effect to the medicine. If you have a true allergy to a medicine, this may prevent that medicine and related medicines from being given to you when you are very ill.  If you have hives or a rash:  Take medicines as directed by your caregiver.  You may use an over-the-counter antihistamine (diphenhydramine) as needed.  Apply cold compresses to the skin or take baths in cool water. Avoid hot baths or showers.  If you are severely allergic:  Continuous observation after a severe reaction may be needed. Hospitalization is often required.  Wear a medical alert bracelet or necklace stating your allergy.  You and your family must learn how to use an anaphylaxis kit or give an epinephrine injection to temporarily treat an emergency allergic reaction. If you have had a severe reaction, always carry your epinephrine injection or anaphylaxis kit with you. This can be lifesaving if you have a severe reaction.  Do not drive or perform tasks after treatment until the medicines used to treat your reaction have worn off, or until your caregiver says it is okay. SEEK MEDICAL CARE IF:   You think you had an allergic reaction. Symptoms usually start within 30 minutes after exposure.  Symptoms are getting worse rather than better.  You develop new symptoms.  The symptoms that brought you to your caregiver return. SEEK IMMEDIATE MEDICAL CARE IF:   You have swelling of the mouth, difficulty breathing, or wheezing.  You have  a tight feeling in your chest or throat.  You develop hives, swelling, or itching all over your body.  You develop severe vomiting or diarrhea.  You feel faint or pass out. This is an emergency. Use your epinephrine injection or anaphylaxis kit as you have been instructed. Call for emergency medical help. Even if you improve after the  injection, you need to be examined at a hospital emergency department. MAKE SURE YOU:   Understand these instructions.  Will watch your condition.  Will get help right away if you are not doing well or get worse. Document Released: 12/10/2005 Document Revised: 03/03/2012 Document Reviewed: 05/16/2011 [facility] Patient Information 2015 [place], [place]. This information is not intended to replace advice given to you by your health care provider. Make sure you discuss any questions you have with your health care provider.

## 2015-06-06 MED FILL — Medication: Qty: 1 | Status: AC

## 2015-07-07 ENCOUNTER — Encounter (HOSPITAL_COMMUNITY): Payer: Self-pay | Admitting: Emergency Medicine

## 2015-07-07 ENCOUNTER — Emergency Department (HOSPITAL_COMMUNITY)
Admission: EM | Admit: 2015-07-07 | Discharge: 2015-07-07 | Disposition: A | Discharge status: Home / Self Care | Payer: Self-pay | Attending: Emergency Medicine | Admitting: Emergency Medicine

## 2015-07-07 ENCOUNTER — Emergency Department (HOSPITAL_COMMUNITY): Payer: Self-pay

## 2015-07-07 DIAGNOSIS — I10 Essential (primary) hypertension: Secondary | ICD-10-CM | POA: Insufficient documentation

## 2015-07-07 DIAGNOSIS — Z720 Tobacco use: Secondary | ICD-10-CM | POA: Insufficient documentation

## 2015-07-07 DIAGNOSIS — H9191 Unspecified hearing loss, right ear: Secondary | ICD-10-CM | POA: Insufficient documentation

## 2015-07-07 DIAGNOSIS — Z79899 Other long term (current) drug therapy: Secondary | ICD-10-CM | POA: Insufficient documentation

## 2015-07-07 DIAGNOSIS — M722 Plantar fascial fibromatosis: Secondary | ICD-10-CM | POA: Insufficient documentation

## 2015-07-07 DIAGNOSIS — M109 Gout, unspecified: Secondary | ICD-10-CM | POA: Insufficient documentation

## 2015-07-07 MED ORDER — PREDNISONE 10 MG (21) PO TBPK: 10.0000 mg | ORAL_TABLET | Freq: Every day | ORAL | Status: DC

## 2015-07-07 MED ORDER — TRAMADOL HCL 50 MG PO TABS: 50.0000 mg | ORAL_TABLET | Freq: Four times a day (QID) | ORAL | Status: DC | PRN

## 2015-07-07 NOTE — ED Notes (Signed)
Patient states he started having trouble  Hearing in the  Right ear about a week ago. Then started notice some swelling in right foot. Patient denies any injury to the foot. Patient able to move both feet, pulses present, and sensation intact. No swelling noted. Patient states" I feel like its a little swelling but they hurt to stand on. " Patient rates pain 8/10.

## 2015-07-07 NOTE — ED Provider Notes (Signed)
CSN: [id number]     Arrival date & time 07/07/15  1141 History  This chart was scribed for non-physician practitioner, [name], PA-C, working with Virgel Manifold, MD, by Stephania Fragmin, ED Scribe. This patient was seen in room TR05C/TR05C and the patient's care was started at 12:55 PM.    Chief Complaint  Patient presents with  . Otalgia  . Foot Swelling   Patient is a 54 y.o. [name] presenting with ear pain. The history is provided by the patient. No language interpreter was used.  Otalgia Location:  Right Behind ear:  No abnormality Quality: patient describes as a sensation of a "fullness" Severity:  No pain Onset quality:  Sudden Duration:  2 weeks Timing:  Constant Progression:  Worsening Chronicity:  New Context: not direct blow and no water in ear   Relieved by:  None tried Worsened by:  Nothing tried Ineffective treatments:  None tried Associated symptoms: hearing loss   Associated symptoms: no congestion, no ear discharge and no fever     HPI Comments: Devin [name] is a 54 y.o. [name] who presents to the Emergency Department complaining of constant, worsening, moderate right ear congestion and hearing loss that began suddenly last week without provocation. He denies any recent swimming, injury, no recent exposure to loud music or other loud noises. 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 He denies ear drainage, fever, nasal congestion, or sinus pressure. Patient denies any recent URI or other viral infection.  He also complains of bilateral foot pain, right greater than left, that began 3 days ago, when he got out of bed. He denies any known trauma or injury. He complains that his foot hurts to stand on, and states it feels like "needles" in his heels. Patient wears a steel-toed shoe at his job, which requires a lot of standing. Patient has taken Extra Strength Tylenol for  his foot pain with minimal relief. Patient states he has never seen an orthopedist. Patient denies either a personal or family history of DM.  Patient does not have a PCP.  Past Medical History  Diagnosis Date  . Hypertension   . Gout    Past Surgical History  Procedure Laterality Date  . Knee surgery     No family history on file. History  Substance Use Topics  . Smoking status: Current Every Day Smoker  . Smokeless tobacco: Not on file  . Alcohol Use: Yes    Review of Systems  Constitutional: Negative for fever.  HENT: Positive for ear pain ("fullness") and hearing loss. Negative for congestion, ear discharge and sinus pressure.   Musculoskeletal: Positive for arthralgias. Negative for myalgias.  Skin: Negative for color change and wound.    Allergies  Review of patient's allergies indicates no known allergies.  Home Medications   Prior to Admission medications   Medication Sig Start Date End Date Taking? Authorizing Provider  acetaminophen (TYLENOL) 500 MG tablet Take 1,000 mg by mouth every 6 (six) hours as needed for mild pain.    Historical Provider, MD  albuterol (PROVENTIL HFA;VENTOLIN HFA) 108 (90 BASE) MCG/ACT inhaler Inhale 1-2 puffs into the lungs every 6 (six) hours as needed for wheezing. 04/15/15   Tanna Furry, MD  amLODipine (NORVASC) 5 MG tablet Take 1 tablet (5 mg total) by mouth daily. 06/04/15   Varney Biles, MD  diphenhydrAMINE (BENADRYL) 25 mg capsule  Take 1 capsule (25 mg total) by mouth every 6 (six) hours as needed for itching. 06/04/15   Varney Biles, MD  HYDROcodone-acetaminophen (NORCO/VICODIN) 5-325 MG per tablet Take 1-2 tablets by mouth every 6 (six) hours as needed for pain. Patient not taking: Reported on 04/14/2015 08/28/13   Fredia Sorrow, MD  lisinopril-hydrochlorothiazide (PRINZIDE) 20-12.5 MG per tablet Take 1 tablet by mouth daily. 04/15/15   Tanna Furry, MD  predniSONE (STERAPRED UNI-PAK 21 TAB) 10 MG (21) TBPK tablet Take 1 tablet (10 mg  total) by mouth daily. Take 6 tabs by mouth daily  for 2 days, then 5 tabs for 2 days, then 4 tabs for 2 days, then 3 tabs for 2 days, 2 tabs for 2 days, then 1 tab by mouth daily for 2 days 07/07/15   [name], PA-C  traMADol (ULTRAM) 50 MG tablet Take 1 tablet (50 mg total) by mouth every 6 (six) hours as needed. 07/07/15   [name], PA-C   BP 152/99 mmHg  Pulse 71  Temp(Src) 97.8 F (36.6 C) (Oral)  Resp 16  SpO2 97% Physical Exam  Constitutional: He appears well-developed and well-nourished.  HENT:  Head: Atraumatic.  Right Ear: Tympanic membrane, external ear and ear canal normal. Decreased hearing is noted.  Left Ear: Hearing, tympanic membrane, external ear and ear canal normal. No decreased hearing is noted.  No cerumen impaction. Unable to hear fingers rubbing together with right ear, normal hearing left.  TM's normal appearance, no effusion. No swelling. Normal appearance of landmarks.  Neck: Normal range of motion.  Cardiovascular:  Pulses equal bilaterally  Musculoskeletal: He exhibits tenderness.  Point tender at plantar fascia insertion inside of heel. No edema or puncture wounds. Skin is intact. Pedal pulses are intact. Achilles' tendon is nontender and without disruption.   Neurological: He is alert. He has normal strength. He displays normal reflexes. No sensory deficit.  Skin: Skin is warm and dry.  Psychiatric: He has a normal mood and affect.  Nursing note and vitals reviewed.   ED Course  Procedures (including critical care time)  DIAGNOSTIC STUDIES: Oxygen Saturation is 97% on RA, normal by my interpretation.    COORDINATION OF CARE: 12:59 PM - Discussed treatment plan with pt at bedside which includes right foot XR to r/o any bone spurs, and pt agreed to plan.   Imaging Review Dg Foot Complete Right  07/07/2015   CLINICAL DATA:  Foot pain for 3 days.  No known trauma.  EXAM: RIGHT FOOT COMPLETE - 3+ VIEW  COMPARISON:  04/02/2013  FINDINGS: There is no  fracture or dislocation or joint effusion. There is moderately severe arthritis of the first metatarsal phalangeal joint, slightly progressed. No other abnormalities.  IMPRESSION: Osteoarthritis of the first metatarsal phalangeal joint. No acute abnormality.   Electronically Signed   By: Lorriane Shire M.D.   On: 07/07/2015 13:57   MDM   Final diagnoses:  Plantar fasciitis  Decreased hearing of right ear    Patients labs and/or radiological studies were reviewed and considered during the medical decision making and disposition process.  Results were also discussed with patient.  foot exam c/w plantar fasciitis.  Instructed in stretching exercises and to get shoe inserts specific for this condition. Tramadol and steroid taper prescribed. Referral given to establish primary care and for f/u care regarding sx.    The patient appears reasonably screened and/or stabilized for discharge and I doubt any other medical condition or other [facility] requiring further screening, evaluation,  or treatment in the ED at this time prior to discharge.  I personally performed the services described in this documentation, which was scribed in my presence. The recorded information has been reviewed and is accurate.   [name], PA-C 07/08/15 2205  Virgel Manifold, MD 07/09/15 [phone]

## 2015-07-07 NOTE — ED Notes (Signed)
Patient returned to room. 

## 2015-07-07 NOTE — Discharge Instructions (Signed)
Plantar Fasciitis Plantar fasciitis is a common condition that causes foot pain. It is soreness (inflammation) of the band of tough fibrous tissue on the bottom of the foot that runs from the heel bone (calcaneus) to the ball of the foot. The cause of this soreness may be from excessive standing, poor fitting shoes, running on hard surfaces, being overweight, having an abnormal walk, or overuse (this is common in runners) of the painful foot or feet. It is also common in aerobic exercise dancers and ballet dancers. SYMPTOMS  Most people with plantar fasciitis complain of:  Severe pain in the morning on the bottom of their foot especially when taking the first steps out of bed. This pain recedes after a few minutes of walking.  Severe pain is experienced also during walking following a long period of inactivity.  Pain is worse when walking barefoot or up stairs DIAGNOSIS   Your caregiver will diagnose this condition by examining and feeling your foot.  Special tests such as X-rays of your foot, are usually not needed. PREVENTION   Consult a sports medicine professional before beginning a new exercise program.  Walking programs offer a good workout. With walking there is a lower chance of overuse injuries common to runners. There is less impact and less jarring of the joints.  Begin all new exercise programs slowly. If problems or pain develop, decrease the amount of time or distance until you are at a comfortable level.  Wear good shoes and replace them regularly.  Stretch your foot and the heel cords at the back of the ankle (Achilles tendon) both before and after exercise.  Run or exercise on even surfaces that are not hard. For example, asphalt is better than pavement.  Do not run barefoot on hard surfaces.  If using a treadmill, vary the incline.  Do not continue to workout if you have foot or joint problems. Seek professional help if they do not improve. HOME CARE INSTRUCTIONS     Avoid activities that cause you pain until you recover.  Use ice or cold packs on the problem or painful areas after working out.  Only take over-the-counter or prescription medicines for pain, discomfort, or fever as directed by your caregiver.  Soft shoe inserts or athletic shoes with air or gel sole cushions may be helpful.  If problems continue or become more severe, consult a sports medicine caregiver or your own health care provider. Cortisone is a potent anti-inflammatory medication that may be injected into the painful area. You can discuss this treatment with your caregiver. MAKE SURE YOU:   Understand these instructions.  Will watch your condition.  Will get help right away if you are not doing well or get worse. Document Released: 09/04/2001 Document Revised: 03/03/2012 Document Reviewed: 11/03/2008 [employer] Patient Information 2015 [place], [place]. This information is not intended to replace advice given to you by your health care provider. Make sure you discuss any questions you have with your health care provider.  Presbycusis Age-related hearing loss (presbycusis) affects nearly one third of the elderly. It generally starts around middle age and is more common in men. The changes causing this take place in the cochlea, a cavity in the middle ear. The cochlea contains many tiny hairs that convert sound vibrations into electrical impulses, which are interpreted by your brain. As we grow older, this type of hearing loss is called sensorineural hearing loss. It is permanent and cannot be corrected surgically. People with this type of hearing loss will  probably need hearing aids. CAUSES Presbycusis is caused by sensorineural hearing loss. There are three different types of hearing loss:  Sensorineural hearing loss--This type of hearing loss happens when nerves in the ear that register sounds and send signals to the brain are damaged.  Conductive hearing loss--This type of  hearing loss happens when sounds cannot get to the inner ear because of problems in the middle or outer ear. Conductive hearing loss can happen if you have too much wax in your ear, and sounds are being muffled. It can also happen if you have an ear infection and parts of your ear are swollen or filled with fluid.  Mixed hearing loss--Mixed hearing loss happens when both types of hearing loss occur at the same time. RISK FACTORS  Age.  Male gender.  Low socioeconomic status.  Noise exposure.  Substances that are toxic to organs in your ear that have to do with hearing and balance (ototoxic agents), such as aminoglycosides, chemotherapeutic agents, or heavy metals).  Certain infections.  Certain medical conditions or diseases (such as hypertension, diabetes, or immunologic disorders).  Hormonal factors. SIGNS AND SYMPTOMS  Slow, progressive hearing loss in older people. The amount of hearing loss is the same in both ears (symmetric). This usually affects how well you hear high-pitched sounds.  Ringing in ears (tinnitus).  Dizziness. DIAGNOSIS The diagnosis of presbycusis is based on your medical history. The physical examination (usually with an otoscope) may be helpful in determining the type of hearing loss as well as possible contributing factors for hearing loss, such as ear wax or other causes. People with hearing loss should have formal audiogram testing to confirm the diagnosis, determine severity, and to direct management. TREATMENT  The hearing loss cannot be corrected surgically. The following things may help you hear better:  Hearing aids.  Cochlear implantation.  Assistive listening devices.  These may be linked with hearing aids for telephone use or frequency-modulation systems that transmit sound information directly to your hearing aid.  Assistive listening devices may also be independent of hearing aids. Touch or visual alerts can compensate for lack of auditory  input, such as flashing lights for a doorbell.  Auditory rehabilitation. HOME CARE INSTRUCTIONS   Face people who are speaking with you so you can see their lips move while they talk.  Watch cues such as hand gestures and facial expressions of people while they are talking with you.  Decrease, or if possible, avoid background noise. SEEK MEDICAL CARE IF:   You have increased trouble hearing.  Someone close to you notices you are having trouble hearing or understanding them. Presbycusis happens slowly over time. You might not notice the changes yourself. SEEK IMMEDIATE MEDICAL CARE IF:  You lose hearing suddenly (over a few hours or a day).  You have numbness.  You have ear or head pain associated with hearing loss.  You have tremors.  You have blackouts.  You have a seizure.  You have areas of weakness in your body. Document Released: 12/07/2000 Document Revised: 09/30/2013 Document Reviewed: 07/07/2013 [facility] Patient Information 2015 [place], [place]. This information is not intended to replace advice given to you by your health care provider. Make sure you discuss any questions you have with your health care provider.

## 2015-11-20 ENCOUNTER — Emergency Department (HOSPITAL_COMMUNITY): Payer: Self-pay

## 2015-11-20 ENCOUNTER — Emergency Department (HOSPITAL_COMMUNITY)
Admission: EM | Admit: 2015-11-20 | Discharge: 2015-11-20 | Disposition: A | Discharge status: Home / Self Care | Payer: Self-pay | Attending: Emergency Medicine | Admitting: Emergency Medicine

## 2015-11-20 ENCOUNTER — Encounter (HOSPITAL_COMMUNITY): Payer: Self-pay | Admitting: *Deleted

## 2015-11-20 DIAGNOSIS — I10 Essential (primary) hypertension: Secondary | ICD-10-CM | POA: Insufficient documentation

## 2015-11-20 DIAGNOSIS — Z79899 Other long term (current) drug therapy: Secondary | ICD-10-CM | POA: Insufficient documentation

## 2015-11-20 DIAGNOSIS — M79672 Pain in left foot: Secondary | ICD-10-CM | POA: Insufficient documentation

## 2015-11-20 DIAGNOSIS — F17210 Nicotine dependence, cigarettes, uncomplicated: Secondary | ICD-10-CM | POA: Insufficient documentation

## 2015-11-20 DIAGNOSIS — R208 Other disturbances of skin sensation: Secondary | ICD-10-CM | POA: Insufficient documentation

## 2015-11-20 DIAGNOSIS — R51 Headache: Secondary | ICD-10-CM

## 2015-11-20 DIAGNOSIS — R519 Headache, unspecified: Secondary | ICD-10-CM

## 2015-11-20 LAB — CBC WITH DIFFERENTIAL/PLATELET
BASOS ABS: 0 10*3/uL (ref 0.0–0.1)
Basophils Relative: 0 %
Eosinophils Absolute: 0.2 10*3/uL (ref 0.0–0.7)
Eosinophils Relative: 2 %
HCT: 44.9 % (ref 39.0–52.0)
Hemoglobin: 14.5 g/dL (ref 13.0–17.0)
LYMPHS ABS: 3.2 10*3/uL (ref 0.7–4.0)
LYMPHS PCT: 39 %
MCH: 28.4 pg (ref 26.0–34.0)
MCHC: 32.3 g/dL (ref 30.0–36.0)
MCV: 88 fL (ref 78.0–100.0)
MONO ABS: 0.6 10*3/uL (ref 0.1–1.0)
Monocytes Relative: 8 %
Neutro Abs: 4.1 10*3/uL (ref 1.7–7.7)
Neutrophils Relative %: 51 %
Platelets: 259 10*3/uL (ref 150–400)
RBC: 5.1 MIL/uL (ref 4.22–5.81)
RDW: 14.7 % (ref 11.5–15.5)
WBC: 8.2 10*3/uL (ref 4.0–10.5)

## 2015-11-20 LAB — COMPREHENSIVE METABOLIC PANEL
ALT: 20 U/L (ref 17–63)
AST: 19 U/L (ref 15–41)
Albumin: 3.8 g/dL (ref 3.5–5.0)
Alkaline Phosphatase: 70 U/L (ref 38–126)
Anion gap: 7 (ref 5–15)
BUN: 12 mg/dL (ref 6–20)
CHLORIDE: 105 mmol/L (ref 101–111)
CO2: 26 mmol/L (ref 22–32)
Calcium: 9 mg/dL (ref 8.9–10.3)
Creatinine, Ser: 0.82 mg/dL (ref 0.61–1.24)
Glucose, Bld: 108 mg/dL — ABNORMAL HIGH (ref 65–99)
POTASSIUM: 4.1 mmol/L (ref 3.5–5.1)
Sodium: 138 mmol/L (ref 135–145)
TOTAL PROTEIN: 7 g/dL (ref 6.5–8.1)
Total Bilirubin: 0.3 mg/dL (ref 0.3–1.2)

## 2015-11-20 MED ORDER — SODIUM CHLORIDE 0.9 % IV BOLUS (SEPSIS)
500.0000 mL | Freq: Once | INTRAVENOUS | Status: AC
  Administered 2015-11-20: 500 mL via INTRAVENOUS

## 2015-11-20 MED ORDER — GABAPENTIN 300 MG PO CAPS: 300.0000 mg | ORAL_CAPSULE | Freq: Every day | ORAL | Status: DC

## 2015-11-20 MED ORDER — HYDRALAZINE HCL 20 MG/ML IJ SOLN
10.0000 mg | Freq: Once | INTRAMUSCULAR | Status: AC
  Administered 2015-11-20: 10 mg via INTRAVENOUS
  Filled 2015-11-20: qty 1

## 2015-11-20 MED ORDER — DIPHENHYDRAMINE HCL 50 MG/ML IJ SOLN
12.5000 mg | Freq: Once | INTRAMUSCULAR | Status: AC
  Administered 2015-11-20: 12.5 mg via INTRAVENOUS
  Filled 2015-11-20: qty 1

## 2015-11-20 MED ORDER — KETOROLAC TROMETHAMINE 30 MG/ML IJ SOLN
30.0000 mg | Freq: Once | INTRAMUSCULAR | Status: AC
  Administered 2015-11-20: 30 mg via INTRAVENOUS
  Filled 2015-11-20: qty 1

## 2015-11-20 MED ORDER — AMLODIPINE BESYLATE 10 MG PO TABS: 10.0000 mg | ORAL_TABLET | Freq: Every day | ORAL | Status: DC

## 2015-11-20 MED ORDER — METOCLOPRAMIDE HCL 5 MG/ML IJ SOLN
10.0000 mg | Freq: Once | INTRAMUSCULAR | Status: AC
  Administered 2015-11-20: 10 mg via INTRAVENOUS
  Filled 2015-11-20: qty 2

## 2015-11-20 NOTE — ED Notes (Signed)
Pt c/o dizziness onset x 2-3 days ago with HA, pt c/o blurred vision, denies slurred speech, pt denies gait changes, pt denies n/v/d, pt A&O x4

## 2015-11-20 NOTE — Discharge Instructions (Signed)
Emergency Department Resource Guide 1) Find a Doctor and Pay Out of Pocket Although you won't have to find out who is covered by your insurance plan, it is a good idea to ask around and get recommendations. You will then need to call the office and see if the doctor you have chosen will accept you as a new patient and what types of options they offer for patients who are self-pay. Some doctors offer discounts or will set up payment plans for their patients who do not have insurance, but you will need to ask so you aren't surprised when you get to your appointment.  2) Contact Your Local Health Department Not all health departments have doctors that can see patients for sick visits, but many do, so it is worth a call to see if yours does. If you don't know where your local health department is, you can check in your phone book. The CDC also has a tool to help you locate your state's health department, and many state websites also have listings of all of their local health departments.  3) Find a [facility] If your illness is not likely to be very severe or complicated, you may want to try a walk in clinic. These are popping up all over the country in pharmacies, drugstores, and shopping centers. They're usually staffed by nurse practitioners or physician assistants that have been trained to treat common illnesses and complaints. They're usually fairly quick and inexpensive. However, if you have serious medical issues or chronic medical problems, these are probably not your best option.   Chronic Pain Problems: Organization         Address     Phone             Notes  [facility]  [phone] Patients need to be referred by their primary care doctor.   Medication Assistance: Organization         Address     Phone             Notes  [facility] Medication [facility] [address] [place], [place] [address] [phone] --Must be a resident of  [facility] -- Must have NO insurance coverage whatsoever (no Medicaid/ Medicare, etc.) -- The pt. MUST have a primary care doctor that directs their care regularly and follows them in the community   MedAssist  [phone]   [employer]  [phone]    Agencies that provide inexpensive medical care: Organization         Address     Phone             Notes  [place]  [phone]   [facility]    [phone]   [facility] [place], [place] [address] [phone]   [place] [address] [place] [phone]   Planned Parenthood    [phone]   [facility]    [phone]   [place] and [address], [place] Phone:  [phone], Fax:  [phone] Hours of Operation:  9 am - 6 pm, M-F.  Also accepts Medicaid/Medicare and self-pay.  [facility] for [place], [address], [place] Phone: [phone], Fax: [phone]. Hours of Operation:  8:30 am - 5:30 pm, M-F.  Also accepts Medicaid and self-pay.  [facility] [place] [address] [place] Phone: [phone]   [place], [place], [place] [phone], Ext. [phone] Mondays & Thursdays: 7-9 AM.  First 15 patients are seen on a first come, first serve basis.   [facility] [address] [place] [address] [phone] Accepts Medicaid   [place] Providers:  Organization         Address     Phone             Notes  [facility] [address], [place] [phone] Also accepts self-pay patients.  [facility] [id number] [place], [place]  [phone]   [place], [address], [place] [phone]   [facility]  [address], [place] [phone]   Lucianne Lei [address], [place]   [phone] Only accepts [place] Access [place] patients after they have their name applied to their card.   Self-Pay (no insurance) in [facility]:  Organization         Address     Phone             Notes  Sickle Cell Patients, [facility] [place] [phone]   [facility] [place] [phone]   [facility] [place]  [place], [place], [place] [phone]   [facility]/Dr. Osei-Bonsu  [address] [place] or [address], [place] [phone] Phone number for both [place] and [place] locations is the same.  Urgent Medical and [facility] [address] [place] [phone]   [facility] [address], [place] or [address] [phone]   [facility] [address] [place] [phone], phone; [phone], fax Sees patients 1st and 3rd Saturday of every month.  Must not qualify for public or private insurance (i.e. Medicaid, Medicare, [place] Health Choice, Veterans' Benefits)  Household income should be no more than 200% of the poverty level The clinic cannot treat you if you are pregnant or think you are pregnant  Sexually transmitted diseases are not treated at the clinic.    Dental Care:  Organization         Address     Phone             Notes  [facility] Department of [facility] [place] [phone] Accepts children up to age 37 who are enrolled in [place] or [place]; pregnant women with a Medicaid card; and children who have applied for Medicaid or [place] Health Choice, but were declined, whose parents can pay a reduced fee at time of service.  [facility] Department of [facility]  [address], [place] [phone] Accepts children up to age 36 who are enrolled in [place] or [place]; pregnant women with a Medicaid card; and children who have applied for Medicaid or [place] Health Choice, but were declined, whose parents can pay a reduced fee at time of service.  [facility] Access PROGRAM  [place] [phone] Patients are seen by  appointment only. Walk-ins are not accepted. [place] will see patients 61 years of age and older. Monday - Tuesday (8am-5pm) Most Wednesdays (8:30-5pm) $30 per visit, cash only  [facility] Adult Dental Access PROGRAM  [address], [facility] [phone] Patients are seen by appointment only. Walk-ins are not accepted. [place] will see patients 46 years of age and older. One Wednesday Evening (Monthly: Volunteer Based).  $30 per visit, cash only  [place]  [phone] for adults; Children under age 22, call Graduate Pediatric Dentistry at [phone]. Children aged 70-14, please call [phone] to request a pediatric application.  Dental services are provided in all areas of dental care including fillings, crowns and bridges, complete and partial dentures, implants, gum treatment, root canals, and extractions. Preventive care is also provided. Treatment is provided to both adults and children. Patients are selected via a lottery and there is often a waiting list.   [facility] [address] [place]  [phone] [email or web address]   [facility] [address] [place], [place] [phone], Ext. [phone] Second and Fourth Thursday of each month, opens at 6:30 AM; Clinic ends at 9 AM.  Patients are seen on a first-come first-served basis, and a limited number are seen during each clinic.   [facility]  [address] [place], [place] [phone]   Eligibility Requirements You  must have lived in [place], [place], or [place] counties for at least the last three months.   You cannot be eligible for state or federal sponsored [employer], including [employer], [place], or [employer].   You generally cannot be eligible for healthcare insurance through your employer.    How to apply: Eligibility screenings are held every Tuesday and Wednesday afternoon from 1:00 pm until 4:00 pm. You do not need an appointment for the interview!  [facility] [address], [place], [place]   [place]  [place] Department  [place]  [phone]    Behavioral Health Resources in the Community: Intensive Outpatient Programs Organization         Address     Phone             Notes  [place]. [address] [place], [place] [phone]   [facility] [address], [place], [place]   ADS: Alcohol & Drug Svcs [address] [place], [place]   [place] [address]  [place], [place] or [phone]     Substance Abuse Resources Organization         Address     Phone             Notes  Alcohol and Drug Services  [phone]   [place]  [phone]   [facility] [place]   Chinita Pester  [phone]   Residential & Outpatient Substance Abuse Program  [phone]   Psychological Services Organization         Address     Phone             Notes  [facility] [place]  [place]  [phone]   [place] [address] [place] or [phone]    Mobile Crisis Teams Organization  Address     Phone             Notes  [facility]rapeutic Alternatives, Mobile Crisis Care Unit  [phone]   Assertive Psychotherapeutic  Services  [address] [place], [place]   [place] [address], [place] [phone]    Self-Help/Support Groups Organization         Address     Phone             Notes  [place]. of [place] - variety of support groups  [place] Call for more information  Narcotics Anonymous (NA), Caring Services [address], [employer] [place]  2 meetings at this location   Special educational needs teacher         Address     Phone             Notes  ASAP Residential Treatment [place],    [place]  [phone]   [facility]  [address], [place] [id number], [place], [place]   [place], [place] [phone] Admissions: 8am-3pm M-F  Incentives Substance [place] [address],    [place], [place] [phone]   [facility] [address] [place], [place], [place]   [facility] [address]  [place], [place]   Insight Programs - Intensive Outpatient [address] Kristeen [address], [place], [place]   [facility] ([place].) [place].,  [place], [place] [phone] or [phone]   [facility]idential Treatment Services (RTS) [address] [place], [place] Accepts Medicaid  [facility] [place] [address]  [place] [phone] Substance Abuse/Addiction Treatment   [facility] Organization         Address     Phone             Notes  [facility]  [phone]   Domenic Schwab, PhD [address] [place]lis [place], [place]   [phone] or [phone]   [place], [place] [phone]   [facility] [address], [place], [place] [phone] Insurance/Medicaid/sponsorship through [facility] and Families [address] [place]                                     [place], [place] [phone] [place] [address], [place] [phone]    Dr. Adele Schilder  [phone]   [facility] Dept. 1) [address] [place] 2) [place] 3)  [place] [address], [place] [phone]  [phone]   [place] [phone] or [phone] (After Hours)     [place]: Abuse and Market researcher         Address     Phone             Notes  Child/Elder Abuse Hotline  [phone]   Family Abuse Services  [phone] 24 hour crisis line  [place]  [place] Substance Use Organization  Address     Phone             Notes  [employer]   [phone] 24 hour crisis line  Advance Access  [address], [place] [phone] Monday- Friday, walk-in,  8am-8pm  RTSA Detoxification & Crisis Stabilization  123XX123   Alcoholics Anonymous (  123XX123 [employer]  Narcotics Anonymous  [place] & [facility]gent Care Centers Organization         Address     Phone             Notes  [place] Department  [place] at [place]   [place]   [facility]  [phone] Uninsured patients meeting eligibility requirement  [place]  [place]  [place]  [facility]   [place]  [phone]     Additional [facility] Resources Organization          Address     Phone             Notes  [place] and [place]  [place]  [phone] Medicaid, Nutrition, Medicine Assistance, Utility Assistance  [place] [phone]   [place] [facility]  [phone]   [place]  [place] of [place]  8738718951 Adult & family shelter, food, utility & rent assistance  24 Hour crisis line for those facing homelessness  [phone]   [facility] Transit  [phone] Roxy Manns, [facility] public transportation system  Homecare Providers  [phone] HIV/AIDS Case Management, FREE HIV SCREEN  Medication Management  [phone] Ongoing medication assistance for patients meeting eligibility requirements  Medication Drop Box Locations: [place] Dept., [facility] Police Dept., [place] Dept., [facility] office  Safely rid of unused medications  [employer]  [phone] Crisis assistance, medication, housing, food, utility assistance  [place].  [facility])  [phone]          Hypertension Hypertension is another name for high blood pressure. High blood pressure forces your heart to work harder to pump blood. A blood pressure reading has two numbers, which includes a higher number over a lower number (example: 110/72). HOME CARE   Have your blood pressure rechecked by your doctor.  Only take medicine as told by your doctor. Follow the directions carefully. The medicine does not work as well if you skip doses. Skipping doses also puts you at risk for problems.  Do not smoke.  Monitor your blood pressure at home as told by your doctor. GET HELP IF:  You think you are having a reaction to the medicine you are taking.  You have repeat headaches or feel dizzy.  You have puffiness (swelling) in your ankles.  You have trouble with your  vision. GET HELP RIGHT AWAY IF:   You get a very bad headache and are confused.  You feel weak, numb, or faint.  You get chest or belly (abdominal) pain.  You throw up (vomit).  You cannot  breathe very well. MAKE SURE YOU:   Understand these instructions.  Will watch your condition.  Will get help right away if you are not doing well or get worse.   This information is not intended to replace advice given to you by your health care provider. Make sure you discuss any questions you have with your health care provider.   Document Released: 05/28/2008 Document Revised: 12/15/2013 Document Reviewed: 10/02/2013 Elsevier Interactive Patient Education 2016 [employer]. Migraine Headache A migraine headache is very bad, throbbing pain on one or both sides of your head. Talk to your doctor about what things may bring on (trigger) your migraine headaches. HOME CARE  Only take medicines as told by your doctor.  Lie down in a dark, quiet room when you have a migraine.  Keep a journal to find out if certain things bring on migraine headaches. For example, write down:  What you eat and drink.  How much sleep you get.  Any change to your diet or medicines.  Lessen how much alcohol you drink.  Quit smoking if you smoke.  Get enough sleep.  Lessen any stress in your life.  Keep lights dim if bright lights bother you or make your migraines worse. GET HELP RIGHT AWAY IF:   Your migraine becomes really bad.  You have a fever.  You have a stiff neck.  You have trouble seeing.  Your muscles are weak, or you lose muscle control.  You lose your balance or have trouble walking.  You feel like you will pass out (faint), or you pass out.  You have really bad symptoms that are different than your first symptoms. MAKE SURE YOU:   Understand these instructions.  Will watch your condition.  Will get help right away if you are not doing well or get worse.   This  information is not intended to replace advice given to you by your health care provider. Make sure you discuss any questions you have with your health care provider.   Document Released: 09/18/2008 Document Revised: 03/03/2012 Document Reviewed: 08/17/2013 Elsevier Interactive Patient Education [employer].

## 2015-11-20 NOTE — ED Provider Notes (Signed)
CSN: [id number]     Arrival date & time 11/20/15  1001 History   First MD Initiated Contact with Patient 11/20/15 1036     No chief complaint on file.  (Consider location/radiation/quality/duration/timing/severity/associated sxs/prior Treatment)  HPI  Patient is a 54 year old male with history of HTN and gout who presents with headache, dizziness, and foot pain. Patient reports that the headache started 3 days ago. Pain is located in the frontal aspect of his head and is pounding. Pain is severe in nature. Patient has never had a headache similar to this before. Pain has worsened over the past 3 days. Has not noticed anything that makes it better or worse. This morning the patient also started feeling "dizzy" with room spinning symptoms. Says that he felt off balance. No syncope or presyncope. He also noticed sharp, stabbing pain to the soles of his feet bilaterally this morning. He denies any weakness or numbness. He had mild photophobia. No vision changes. No fevers or chills. He does not have a history of migraines. No nausea or vomiting. No recent illnesses.   Past Medical History  Diagnosis Date  . Hypertension   . Gout    Past Surgical History  Procedure Laterality Date  . Knee surgery     History reviewed. No pertinent family history. Social History  Substance Use Topics  . Smoking status: Current Every Day Smoker -- 0.50 packs/day    Types: Cigarettes  . Smokeless tobacco: None  . Alcohol Use: Yes    Review of Systems  Constitutional: Negative for fever and chills.  Eyes: Negative.   Respiratory: Negative for shortness of breath.   Cardiovascular: Negative for chest pain.  Gastrointestinal: Negative for nausea and vomiting.  Endocrine: Negative.   Genitourinary: Negative.   Musculoskeletal: Negative.   Skin: Negative.   Allergic/Immunologic: Negative.   Neurological: Positive for dizziness and headaches. Negative for syncope, weakness and numbness.  Hematological:  Negative.   Psychiatric/Behavioral: Negative.       Allergies  Review of patient's allergies indicates no known allergies.  Home Medications   Prior to Admission medications   Medication Sig Start Date End Date Taking? Authorizing Provider  acetaminophen (TYLENOL) 500 MG tablet Take 1,000 mg by mouth every 6 (six) hours as needed for mild pain.   Yes Historical Provider, MD  albuterol (PROVENTIL HFA;VENTOLIN HFA) 108 (90 BASE) MCG/ACT inhaler Inhale 1-2 puffs into the lungs every 6 (six) hours as needed for wheezing. 04/15/15  Yes Tanna Furry, MD  diphenhydrAMINE (BENADRYL) 25 mg capsule Take 1 capsule (25 mg total) by mouth every 6 (six) hours as needed for itching. 06/04/15  Yes Varney Biles, MD  amLODipine (NORVASC) 10 MG tablet Take 1 tablet (10 mg total) by mouth daily. 11/20/15   Vivi Barrack, MD  gabapentin (NEURONTIN) 300 MG capsule Take 1 capsule (300 mg total) by mouth at bedtime. 11/20/15   Vivi Barrack, MD   BP 162/102 mmHg  Pulse 73  Temp(Src) 98.4 F (36.9 C) (Oral)  Resp 15  Ht 6\' 2"  (1.88 m)  Wt 104.327 kg  BMI 29.52 kg/m2  SpO2 100% Physical Exam  Constitutional: He is oriented to person, place, and time. He appears well-developed and well-nourished. No distress.  HENT:  Head: Normocephalic and atraumatic.  Eyes: EOM are normal. Pupils are equal, round, and reactive to light.  Neck: Normal range of motion. Neck supple.  Cardiovascular: Normal rate, regular rhythm and normal heart sounds.   Pulmonary/Chest: Effort normal and breath  sounds normal. No respiratory distress. He has no wheezes.  Abdominal: Soft. Bowel sounds are normal. He exhibits no distension. There is no tenderness.  Musculoskeletal: Normal range of motion.  Neurological: He is alert and oriented to person, place, and time. He has normal strength. No cranial nerve deficit or sensory deficit.  Sole of left foot painful with light touch.   Skin: Skin is warm and dry. No rash noted. No erythema.   Psychiatric: He has a normal mood and affect. His behavior is normal.  Nursing note and vitals reviewed.   ED Course  Procedures (including critical care time) Labs Review Labs Reviewed  COMPREHENSIVE METABOLIC PANEL - Abnormal; Notable for the following:    Glucose, Bld 108 (*)    All other components within normal limits  CBC WITH DIFFERENTIAL/PLATELET    Imaging Review Ct Head Wo Contrast  11/20/2015  CLINICAL DATA:  54 year old male with headache and dizziness for the past 2-3 days. Blurry vision. EXAM: CT HEAD WITHOUT CONTRAST TECHNIQUE: Contiguous axial images were obtained from the base of the skull through the vertex without intravenous contrast. COMPARISON:  No priors. FINDINGS: Patchy areas of decreased attenuation are noted throughout the deep and periventricular white matter of the cerebral hemispheres bilaterally, compatible with mild chronic microvascular ischemic disease. Atherosclerosis in the visualized vasculature. No acute intracranial abnormalities. Specifically, no evidence of acute intracranial hemorrhage, no definite findings of acute/subacute cerebral ischemia, no mass, mass effect, hydrocephalus or abnormal intra or extra-axial fluid collections. Visualized paranasal sinuses and mastoids are well pneumatized. No acute displaced skull fractures are identified. IMPRESSION: 1. No acute intracranial abnormalities. 2. Mild chronic microvascular ischemic changes in cerebral white matter, as above. 3. Atherosclerosis. Electronically Signed   By: Vinnie Langton M.D.   On: 11/20/2015 11:48   I have personally reviewed and evaluated these images and lab results as part of my medical decision-making.   EKG Interpretation None      MDM   Final diagnoses:  Headache, unspecified headache type  Essential hypertension  Allodynia   Patient is a 54 year old male with history of HTN and gout presenting with headache, dizziness, and foot pain. Patient with normal allodynia  on the sole of his left foot, otherwise normal neurological exam with no focal deficits. CBc and CMP unremarkable here. Head CT with no acute findings. Pain and dizziness improved in ED with migraine cocktail. Patient was additionally noted to be hypertensive here, which he attributed to being off his blood pressure medications. This improved with administration of hydralazine in the ED. Patient able to safely ambulate around the ED and take PO without issue. Patient's foot pain seems to be neuropathic given allodynia. No effusions or deformities noted on physical exam. Stressed the importance of establishing care with a PCP. Patient agreed with this plan. Will discharge home with a new prescription for amlodipine and gabapentin for his neuropathic pain. Return precautions reviewed.     Vivi Barrack, MD 11/20/15 1514  Carmin Muskrat, MD 11/26/15 [id number]  Carmin Muskrat, MD 11/26/15 [phone]

## 2015-11-20 NOTE — ED Notes (Signed)
Pt does not have a PMD and has not had his BP meds refilled in a while.

## 2015-12-27 ENCOUNTER — Encounter: Payer: Self-pay | Admitting: Internal Medicine

## 2015-12-27 ENCOUNTER — Ambulatory Visit (INDEPENDENT_AMBULATORY_CARE_PROVIDER_SITE_OTHER): Payer: BLUE CROSS/BLUE SHIELD | Admitting: Internal Medicine

## 2015-12-27 ENCOUNTER — Other Ambulatory Visit (INDEPENDENT_AMBULATORY_CARE_PROVIDER_SITE_OTHER): Payer: BLUE CROSS/BLUE SHIELD

## 2015-12-27 VITALS — BP 158/102 | HR 88 | Temp 98.5°F | Resp 16 | Ht 74.0 in | Wt 264.0 lb

## 2015-12-27 DIAGNOSIS — M79671 Pain in right foot: Secondary | ICD-10-CM | POA: Insufficient documentation

## 2015-12-27 DIAGNOSIS — I10 Essential (primary) hypertension: Secondary | ICD-10-CM

## 2015-12-27 DIAGNOSIS — N521 Erectile dysfunction due to diseases classified elsewhere: Secondary | ICD-10-CM | POA: Diagnosis not present

## 2015-12-27 DIAGNOSIS — Z8639 Personal history of other endocrine, nutritional and metabolic disease: Secondary | ICD-10-CM

## 2015-12-27 DIAGNOSIS — E669 Obesity, unspecified: Secondary | ICD-10-CM

## 2015-12-27 DIAGNOSIS — N529 Male erectile dysfunction, unspecified: Secondary | ICD-10-CM | POA: Insufficient documentation

## 2015-12-27 DIAGNOSIS — M79672 Pain in left foot: Secondary | ICD-10-CM

## 2015-12-27 DIAGNOSIS — Z23 Encounter for immunization: Secondary | ICD-10-CM

## 2015-12-27 DIAGNOSIS — Z8739 Personal history of other diseases of the musculoskeletal system and connective tissue: Secondary | ICD-10-CM | POA: Insufficient documentation

## 2015-12-27 DIAGNOSIS — Z720 Tobacco use: Secondary | ICD-10-CM

## 2015-12-27 DIAGNOSIS — M25579 Pain in unspecified ankle and joints of unspecified foot: Secondary | ICD-10-CM

## 2015-12-27 LAB — LIPID PANEL
CHOL/HDL RATIO: 5
Cholesterol: 188 mg/dL (ref 0–200)
HDL: 40.5 mg/dL (ref >39.00)
LDL Cholesterol: 117 mg/dL — ABNORMAL HIGH (ref 0–99)
NonHDL: 147.54
TRIGLYCERIDES: 151 mg/dL — AB (ref 0.0–149.0)
VLDL: 30.2 mg/dL (ref 0.0–40.0)

## 2015-12-27 LAB — HEMOGLOBIN A1C: Hgb A1c MFr Bld: 6.3 % (ref 4.6–6.5)

## 2015-12-27 MED ORDER — LISINOPRIL 20 MG PO TABS: 20.0000 mg | ORAL_TABLET | Freq: Every day | ORAL | Status: DC

## 2015-12-27 MED ORDER — AMLODIPINE BESYLATE 10 MG PO TABS: 10.0000 mg | ORAL_TABLET | Freq: Every day | ORAL | Status: DC

## 2015-12-27 MED ORDER — COLCHICINE 0.6 MG PO TABS: 0.6000 mg | ORAL_TABLET | Freq: Every day | ORAL | Status: DC

## 2015-12-27 NOTE — Assessment & Plan Note (Signed)
Rx for colchicine for pain to see if the foot pain is due to gout. Will not check uric acid today due to possible flare.

## 2015-12-27 NOTE — Assessment & Plan Note (Signed)
Sounds to be plantar fasciitis and stands for prolonged time at his job. Given rehab exercises and if no improvement will send to sports medicine.

## 2015-12-27 NOTE — Patient Instructions (Addendum)
We have sent in the colchicine that you can take daily to help with the pain in your feet.   We will get you in with the urologist who is the expert.   We have sent in the refill of the amlodipine, keep taking 1 pill daily. We have also sent in lisinopril 20 mg daily to take for blood pressure. This is also 1 pill daily.   The high blood pressure can affect erections and getting the blood pressure under control can help.   We would like to see you back in about 4-6 weeks for the blood pressure.   If you have any problems or questions before then please feel free to call us.   Plantar Fasciitis With Rehab The plantar fascia is a fibrous, ligament-like, soft-tissue structure that spans the bottom of the foot. Plantar fasciitis, also called heel spur syndrome, is a condition that causes pain in the foot due to inflammation of the tissue. SYMPTOMS   Pain and tenderness on the underneath side of the foot.  Pain that worsens with standing or walking. CAUSES  Plantar fasciitis is caused by irritation and injury to the plantar fascia on the underneath side of the foot. Common mechanisms of injury include:  Direct trauma to bottom of the foot.  Damage to a small nerve that runs under the foot where the main fascia attaches to the heel bone.  Stress placed on the plantar fascia due to bone spurs. RISK INCREASES WITH:   Activities that place stress on the plantar fascia (running, jumping, pivoting, or cutting).  Poor strength and flexibility.  Improperly fitted shoes.  Tight calf muscles.  Flat feet.  Failure to warm-up properly before activity.  Obesity. PREVENTION  Warm up and stretch properly before activity.  Allow for adequate recovery between workouts.  Maintain physical fitness:  Strength, flexibility, and endurance.  Cardiovascular fitness.  Maintain a health body weight.  Avoid stress on the plantar fascia.  Wear properly fitted shoes, including arch supports  for individuals who have flat feet. PROGNOSIS  If treated properly, then the symptoms of plantar fasciitis usually resolve without surgery. However, occasionally surgery is necessary. RELATED COMPLICATIONS   Recurrent symptoms that may result in a chronic condition.  Problems of the lower back that are caused by compensating for the injury, such as limping.  Pain or weakness of the foot during push-off following surgery.  Chronic inflammation, scarring, and partial or complete fascia tear, occurring more often from repeated injections. TREATMENT  Treatment initially involves the use of ice and medication to help reduce pain and inflammation. The use of strengthening and stretching exercises may help reduce pain with activity, especially stretches of the Achilles tendon. These exercises may be performed at home or with a therapist. Your caregiver may recommend that you use heel cups of arch supports to help reduce stress on the plantar fascia. Occasionally, corticosteroid injections are given to reduce inflammation. If symptoms persist for greater than 6 months despite non-surgical (conservative), then surgery may be recommended.  MEDICATION   If pain medication is necessary, then nonsteroidal anti-inflammatory medications, such as aspirin and ibuprofen, or other minor pain relievers, such as acetaminophen, are often recommended.  Do not take pain medication within 7 days before surgery.  Prescription pain relievers may be given if deemed necessary by your caregiver. Use only as directed and only as much as you need.  Corticosteroid injections may be given by your caregiver. These injections should be reserved for the most serious  cases, because they may only be given a certain number of times. HEAT AND COLD  Cold treatment (icing) relieves pain and reduces inflammation. Cold treatment should be applied for 10 to 15 minutes every 2 to 3 hours for inflammation and pain and immediately after  any activity that aggravates your symptoms. Use ice packs or massage the area with a piece of ice (ice massage).  Heat treatment may be used prior to performing the stretching and strengthening activities prescribed by your caregiver, physical therapist, or athletic trainer. Use a heat pack or soak the injury in warm water. SEEK IMMEDIATE MEDICAL CARE IF:  Treatment seems to offer no benefit, or the condition worsens.  Any medications produce adverse side effects. EXERCISES RANGE OF MOTION (ROM) AND STRETCHING EXERCISES - Plantar Fasciitis (Heel Spur Syndrome) These exercises may help you when beginning to rehabilitate your injury. Your symptoms may resolve with or without further involvement from your physician, physical therapist or athletic trainer. While completing these exercises, remember:   Restoring tissue flexibility helps normal motion to return to the joints. This allows healthier, less painful movement and activity.  An effective stretch should be held for at least 30 seconds.  A stretch should never be painful. You should only feel a gentle lengthening or release in the stretched tissue. RANGE OF MOTION - Toe Extension, Flexion  Sit with your right / left leg crossed over your opposite knee.  Grasp your toes and gently pull them back toward the top of your foot. You should feel a stretch on the bottom of your toes and/or foot.  Hold this stretch for __________ seconds.  Now, gently pull your toes toward the bottom of your foot. You should feel a stretch on the top of your toes and or foot.  Hold this stretch for __________ seconds. Repeat __________ times. Complete this stretch __________ times per day.  RANGE OF MOTION - Ankle Dorsiflexion, Active Assisted  Remove shoes and sit on a chair that is preferably not on a carpeted surface.  Place right / left foot under knee. Extend your opposite leg for support.  Keeping your heel down, slide your right / left foot back  toward the chair until you feel a stretch at your ankle or calf. If you do not feel a stretch, slide your bottom forward to the edge of the chair, while still keeping your heel down.  Hold this stretch for __________ seconds. Repeat __________ times. Complete this stretch __________ times per day.  STRETCH - Gastroc, Standing  Place hands on wall.  Extend right / left leg, keeping the front knee somewhat bent.  Slightly point your toes inward on your back foot.  Keeping your right / left heel on the floor and your knee straight, shift your weight toward the wall, not allowing your back to arch.  You should feel a gentle stretch in the right / left calf. Hold this position for __________ seconds. Repeat __________ times. Complete this stretch __________ times per day. STRETCH - [place]us, Standing  Place hands on wall.  Extend right / left leg, keeping the other knee somewhat bent.  Slightly point your toes inward on your back foot.  Keep your right / left heel on the floor, bend your back knee, and slightly shift your weight over the back leg so that you feel a gentle stretch deep in your back calf.  Hold this position for __________ seconds. Repeat __________ times. Complete this stretch __________ times per day. STRETCH - [place]ows,  Standing  Note: This exercise can place a lot of stress on your foot and ankle. Please complete this exercise only if specifically instructed by your caregiver.   Place the ball of your right / left foot on a step, keeping your other foot firmly on the same step.  Hold on to the wall or a rail for balance.  Slowly lift your other foot, allowing your body weight to press your heel down over the edge of the step.  You should feel a stretch in your right / left calf.  Hold this position for __________ seconds.  Repeat this exercise with a slight bend in your right / left knee. Repeat __________ times. Complete this stretch __________ times per  day.  STRENGTHENING EXERCISES - Plantar Fasciitis (Heel Spur Syndrome)  These exercises may help you when beginning to rehabilitate your injury. They may resolve your symptoms with or without further involvement from your physician, physical therapist or athletic trainer. While completing these exercises, remember:   Muscles can gain both the endurance and the strength needed for everyday activities through controlled exercises.  Complete these exercises as instructed by your physician, physical therapist or athletic trainer. Progress the resistance and repetitions only as guided. STRENGTH - Towel Curls  Sit in a chair positioned on a non-carpeted surface.  Place your foot on a towel, keeping your heel on the floor.  Pull the towel toward your heel by only curling your toes. Keep your heel on the floor.  If instructed by your physician, physical therapist or athletic trainer, add ____________________ at the end of the towel. Repeat __________ times. Complete this exercise __________ times per day. STRENGTH - Ankle Inversion  Secure one end of a rubber exercise band/tubing to a fixed object (table, pole). Loop the other end around your foot just before your toes.  Place your fists between your knees. This will focus your strengthening at your ankle.  Slowly, pull your big toe up and in, making sure the band/tubing is positioned to resist the entire motion.  Hold this position for __________ seconds.  Have your muscles resist the band/tubing as it slowly pulls your foot back to the starting position. Repeat __________ times. Complete this exercises __________ times per day.    This information is not intended to replace advice given to you by your health care provider. Make sure you discuss any questions you have with your health care provider.   Document Released: 12/10/2005 Document Revised: 04/26/2015 Document Reviewed: 03/24/2009 Elsevier Interactive Patient Education 2016  [employer].   Hypertension Hypertension, commonly called high blood pressure, is when the force of blood pumping through your arteries is too strong. Your arteries are the blood vessels that carry blood from your heart throughout your body. A blood pressure reading consists of a higher number over a lower number, such as 110/72. The higher number (systolic) is the pressure inside your arteries when your heart pumps. The lower number (diastolic) is the pressure inside your arteries when your heart relaxes. Ideally you want your blood pressure below 120/80. Hypertension forces your heart to work harder to pump blood. Your arteries may become narrow or stiff. Having untreated or uncontrolled hypertension can cause heart attack, stroke, kidney disease, and other problems. RISK FACTORS Some risk factors for high blood pressure are controllable. Others are not.  Risk factors you cannot control include:   Race. You may be at higher risk if you are African American.  Age. Risk increases with age.  Gender. Men  are at higher risk than women before age 37 years. After age 36, women are at higher risk than men. Risk factors you can control include:  Not getting enough exercise or physical activity.  Being overweight.  Getting too much fat, sugar, calories, or salt in your diet.  Drinking too much alcohol. SIGNS AND SYMPTOMS Hypertension does not usually cause signs or symptoms. Extremely high blood pressure (hypertensive crisis) may cause headache, anxiety, shortness of breath, and nosebleed. DIAGNOSIS To check if you have hypertension, your health care provider will measure your blood pressure while you are seated, with your arm held at the level of your heart. It should be measured at least twice using the same arm. Certain conditions can cause a difference in blood pressure between your right and left arms. A blood pressure reading that is higher than normal on one occasion does not mean that you  need treatment. If it is not clear whether you have high blood pressure, you may be asked to return on a different day to have your blood pressure checked again. Or, you may be asked to monitor your blood pressure at home for 1 or more weeks. TREATMENT Treating high blood pressure includes making lifestyle changes and possibly taking medicine. Living a healthy lifestyle can help lower high blood pressure. You may need to change some of your habits. Lifestyle changes may include:  Following the DASH diet. This diet is high in fruits, vegetables, and whole grains. It is low in salt, red meat, and added sugars.  Keep your sodium intake below 2,300 mg per day.  Getting at least 30-45 minutes of aerobic exercise at least 4 times per week.  Losing weight if necessary.  Not smoking.  Limiting alcoholic beverages.  Learning ways to reduce stress. Your health care provider may prescribe medicine if lifestyle changes are not enough to get your blood pressure under control, and if one of the following is true:  You are 54-31 years of age and your systolic blood pressure is above 140.  You are 12 years of age or older, and your systolic blood pressure is above 150.  Your diastolic blood pressure is above 90.  You have diabetes, and your systolic blood pressure is over [id number] or your diastolic blood pressure is over 90.  You have kidney disease and your blood pressure is above 140/90.  You have heart disease and your blood pressure is above 140/90. Your personal target blood pressure may vary depending on your medical conditions, your age, and other factors. HOME CARE INSTRUCTIONS  Have your blood pressure rechecked as directed by your health care provider.   Take medicines only as directed by your health care provider. Follow the directions carefully. Blood pressure medicines must be taken as prescribed. The medicine does not work as well when you skip doses. Skipping doses also puts you at  risk for problems.  Do not smoke.   Monitor your blood pressure at home as directed by your health care provider. SEEK MEDICAL CARE IF:   You think you are having a reaction to medicines taken.  You have recurrent headaches or feel dizzy.  You have swelling in your ankles.  You have trouble with your vision. SEEK IMMEDIATE MEDICAL CARE IF:  You develop a severe headache or confusion.  You have unusual weakness, numbness, or feel faint.  You have severe chest or abdominal pain.  You vomit repeatedly.  You have trouble breathing. MAKE SURE YOU:   Understand these instructions.  Will watch your condition.  Will get help right away if you are not doing well or get worse.   This information is not intended to replace advice given to you by your health care provider. Make sure you discuss any questions you have with your health care provider.   Document Released: 12/10/2005 Document Revised: 04/26/2015 Document Reviewed: 10/02/2013 Elsevier Interactive Patient Education [employer].

## 2015-12-27 NOTE — Assessment & Plan Note (Signed)
Smoking .5 PPD and talked to him about the risks and harms of cigarette smoke. He does not feel able to quit at this time. We discussed smoking cases vascular damage and could be related to his ED.

## 2015-12-27 NOTE — Progress Notes (Signed)
   Subjective:    Patient ID: Devin [name], male    DOB: [birth year], 55 y.o.   MRN: [id number]  HPI The patient is a 55 YO man coming in new with several concerns. He is having erectile dysfunction which is very bothersome to him. He has borrowed some viagra and cialis from a friend and it did not help. He is able to get erection but not able to maintain an erection. This is hurting his relationship with his girlfriend.  Next problem is his blood pressure. Has been high for some time, he has been put on amlodipine which he is taking but does not feel that it is helping. Denies headaches, chest pains, SOB with exertion, abdominal pain or diarrhea or constipation.   PMH, [facility], social history reviewed and updated.   Review of Systems  Constitutional: Negative for fever, chills, activity change, appetite change, fatigue and unexpected weight change.  HENT: Negative.   Eyes: Negative.   Respiratory: Negative for cough, chest tightness, shortness of breath and wheezing.   Cardiovascular: Negative for chest pain, palpitations and leg swelling.  Gastrointestinal: Negative for nausea, abdominal pain, diarrhea, constipation and abdominal distention.  Genitourinary:       ED  Musculoskeletal: Positive for arthralgias. Negative for myalgias, back pain and gait problem.  Skin: Negative.   Neurological: Negative for dizziness, weakness, light-headedness, numbness and headaches.  Psychiatric/Behavioral: Negative.       Objective:   Physical Exam  Constitutional: He is oriented to person, place, and time. He appears well-developed and well-nourished.  Overweight  HENT:  Head: Normocephalic and atraumatic.  Eyes: EOM are normal.  Neck: Normal range of motion.  Cardiovascular: Normal rate and regular rhythm.   Pulmonary/Chest: Effort normal and breath sounds normal. No respiratory distress. He has no wheezes. He has no rales.  Abdominal: Soft. Bowel sounds are normal. He exhibits no distension. There  is no tenderness. There is no rebound.  Musculoskeletal: He exhibits no edema.  Neurological: He is alert and oriented to person, place, and time. Coordination normal.  Skin: Skin is warm and dry.  Psychiatric: He has a normal mood and affect.   Filed Vitals:   12/27/15 1350  BP: 158/102  Pulse: 88  Temp: 98.5 F (36.9 C)  TempSrc: Oral  Resp: 16  Height: 6\' 2"  (1.88 m)  Weight: 264 lb (119.75 kg)  SpO2: 98%      Assessment & Plan:  Flu shot and Tdap given at visit.

## 2015-12-27 NOTE — Assessment & Plan Note (Signed)
Checking HgA1c and lipid panel for complications. Last lipid panel with hypertriglyceridemia.

## 2015-12-27 NOTE — Assessment & Plan Note (Signed)
Referral to urology since viagra and cialis were ineffective per his usage. Likely related to his blood pressure and talked to him about the need to control his BP to help.

## 2015-12-27 NOTE — Progress Notes (Signed)
Pre visit review using our clinic review tool, if applicable. No additional management support is needed unless otherwise documented below in the visit note. 

## 2015-12-27 NOTE — Assessment & Plan Note (Signed)
Continue amlodipine 10 mg daily and add lisinopril 20 mg daily. Will not use hctz due to gout history although diagnosis and confirmation is not known.

## 2015-12-28 ENCOUNTER — Encounter: Payer: Self-pay | Admitting: Internal Medicine

## 2016-01-05 ENCOUNTER — Telehealth: Payer: Self-pay

## 2016-01-05 NOTE — Telephone Encounter (Signed)
PA initiated via covermymeds. Key for PA is [facility]

## 2016-01-24 ENCOUNTER — Ambulatory Visit (INDEPENDENT_AMBULATORY_CARE_PROVIDER_SITE_OTHER): Payer: BLUE CROSS/BLUE SHIELD | Admitting: Internal Medicine

## 2016-01-24 ENCOUNTER — Encounter: Payer: Self-pay | Admitting: Internal Medicine

## 2016-01-24 VITALS — BP 136/98 | HR 85 | Temp 98.3°F | Resp 14 | Ht 74.0 in | Wt 266.0 lb

## 2016-01-24 DIAGNOSIS — Z720 Tobacco use: Secondary | ICD-10-CM | POA: Diagnosis not present

## 2016-01-24 DIAGNOSIS — I10 Essential (primary) hypertension: Secondary | ICD-10-CM | POA: Diagnosis not present

## 2016-01-24 MED ORDER — LISINOPRIL 20 MG PO TABS: 20.0000 mg | ORAL_TABLET | Freq: Every day | ORAL | Status: DC

## 2016-01-24 MED ORDER — CYCLOBENZAPRINE HCL 5 MG PO TABS: 5.0000 mg | ORAL_TABLET | Freq: Three times a day (TID) | ORAL | Status: DC | PRN

## 2016-01-24 NOTE — Progress Notes (Signed)
   Subjective:    Patient ID: Devin [name], male    DOB: 03-Sep-1961, 55 y.o.   MRN: [id number]  HPI The patient is a 55 YO man coming in for follow up of his blood pressure. He is only taking one of his blood pressure medicines right now. He never got the new prescription of the lisinopril since last visit. No new problems. Still having some pain in his feet after a long day of working. No cough or cold symptoms. No chest pains or SOB.   Review of Systems  Constitutional: Negative for fever, chills, activity change, appetite change, fatigue and unexpected weight change.  HENT: Negative.   Eyes: Negative.   Respiratory: Negative for cough, chest tightness, shortness of breath and wheezing.   Cardiovascular: Negative for chest pain, palpitations and leg swelling.  Gastrointestinal: Negative for nausea, abdominal pain, diarrhea, constipation and abdominal distention.  Genitourinary:       ED  Musculoskeletal: Positive for arthralgias. Negative for myalgias, back pain and gait problem.  Skin: Negative.   Neurological: Negative for dizziness, weakness, light-headedness, numbness and headaches.  Psychiatric/Behavioral: Negative.       Objective:   Physical Exam  Constitutional: He is oriented to person, place, and time. He appears well-developed and well-nourished.  Overweight  HENT:  Head: Normocephalic and atraumatic.  Eyes: EOM are normal.  Neck: Normal range of motion.  Cardiovascular: Normal rate and regular rhythm.   Pulmonary/Chest: Effort normal and breath sounds normal. No respiratory distress. He has no wheezes. He has no rales.  Abdominal: Soft. Bowel sounds are normal. He exhibits no distension. There is no tenderness. There is no rebound.  Musculoskeletal: He exhibits no edema.  Neurological: He is alert and oriented to person, place, and time. Coordination normal.  Skin: Skin is warm and dry.  Psychiatric: He has a normal mood and affect.   Filed Vitals:   01/24/16 1308    BP: 136/98  Pulse: 85  Temp: 98.3 F (36.8 C)  TempSrc: Oral  Resp: 14  Height: 6\' 2"  (1.88 m)  Weight: 266 lb (120.657 kg)  SpO2: 98%      Assessment & Plan:

## 2016-01-24 NOTE — Patient Instructions (Signed)
We have sent in the blood pressure medicine lisinopril which you will take 1 pill daily. After 3-4 weeks of taking the medicine come to the lab to get blood drawn.  We have also sent in flexeril for the foot pain which helps and you can use 2-3 times daily if needed.

## 2016-01-24 NOTE — Assessment & Plan Note (Signed)
He was not able to start the lisinopril since last visit. We have resent the prescription as he switched pharmacies since last visit which could be the problem. BP outside goal and will not check CMP today since no medicine change. Needs to come for labs 4 weeks after starting lisinopril.

## 2016-01-24 NOTE — Progress Notes (Signed)
Pre visit review using our clinic review tool, if applicable. No additional management support is needed unless otherwise documented below in the visit note. 

## 2016-01-25 ENCOUNTER — Encounter: Payer: Self-pay | Admitting: Internal Medicine

## 2016-01-25 NOTE — Assessment & Plan Note (Signed)
Still smoking but smoking less. Reminded him of the risks and harms of cigarette smoke.

## 2016-02-04 ENCOUNTER — Encounter (HOSPITAL_COMMUNITY): Payer: Self-pay | Admitting: Emergency Medicine

## 2016-02-04 ENCOUNTER — Emergency Department (HOSPITAL_COMMUNITY)
Admission: EM | Admit: 2016-02-04 | Discharge: 2016-02-04 | Disposition: A | Discharge status: Home / Self Care | Payer: BLUE CROSS/BLUE SHIELD | Attending: Emergency Medicine | Admitting: Emergency Medicine

## 2016-02-04 DIAGNOSIS — J069 Acute upper respiratory infection, unspecified: Secondary | ICD-10-CM | POA: Diagnosis not present

## 2016-02-04 DIAGNOSIS — Z79899 Other long term (current) drug therapy: Secondary | ICD-10-CM | POA: Diagnosis not present

## 2016-02-04 DIAGNOSIS — I10 Essential (primary) hypertension: Secondary | ICD-10-CM | POA: Insufficient documentation

## 2016-02-04 DIAGNOSIS — R197 Diarrhea, unspecified: Secondary | ICD-10-CM | POA: Diagnosis not present

## 2016-02-04 DIAGNOSIS — F17210 Nicotine dependence, cigarettes, uncomplicated: Secondary | ICD-10-CM | POA: Diagnosis not present

## 2016-02-04 DIAGNOSIS — M109 Gout, unspecified: Secondary | ICD-10-CM | POA: Insufficient documentation

## 2016-02-04 DIAGNOSIS — R51 Headache: Secondary | ICD-10-CM | POA: Diagnosis present

## 2016-02-04 MED ORDER — GUAIFENESIN ER 600 MG PO TB12
1200.0000 mg | ORAL_TABLET | Freq: Once | ORAL | Status: AC
Start: 2016-02-04 — End: 2016-02-04
  Administered 2016-02-04: 1200 mg via ORAL
  Filled 2016-02-04: qty 2

## 2016-02-04 MED ORDER — GUAIFENESIN ER 600 MG PO TB12: 1200.0000 mg | ORAL_TABLET | Freq: Two times a day (BID) | ORAL | Status: DC

## 2016-02-04 NOTE — ED Provider Notes (Signed)
CSN: [id number]     Arrival date & time 02/04/16  0555 History   First MD Initiated Contact with Patient 02/04/16 [phone]     Chief Complaint  Patient presents with  . Headache   (Consider location/radiation/quality/duration/timing/severity/associated sxs/prior Treatment) Patient is a 55 y.o. male presenting with headaches. The history is provided by the patient. No language interpreter was used.  Headache Associated symptoms: cough and diarrhea   Associated symptoms: no abdominal pain, no fever and no vomiting     Mr. Heagney is a 55 y.o male with a history of hypertension and gout who presents for a headache, productive yellow sputum cough and congestion with body aches that began 4 hours ago before getting off of work.  He took tylenol 1 hour ago for his symptoms.  Denies sick contacts.  He smokes 4-5 cigarettes/ day.  Denies any fever, throat pain, chills, shortness of breath, wheezing, abdominal pain, or vomiting.   Past Medical History  Diagnosis Date  . Hypertension   . Gout    Past Surgical History  Procedure Laterality Date  . Knee surgery     Family History  Problem Relation Age of Onset  . Cancer Mother   . Stroke Father    Social History  Substance Use Topics  . Smoking status: Current Every Day Smoker -- 0.50 packs/day    Types: Cigarettes  . Smokeless tobacco: None  . Alcohol Use: Yes    Review of Systems  Constitutional: Negative for fever and chills.  Respiratory: Positive for cough. Negative for shortness of breath.   Cardiovascular: Negative for chest pain.  Gastrointestinal: Positive for diarrhea. Negative for vomiting and abdominal pain.  Neurological: Positive for headaches.  All other systems reviewed and are negative.     Allergies  Review of patient's allergies indicates no known allergies.  Home Medications   Prior to Admission medications   Medication Sig Start Date End Date Taking? Authorizing Provider  acetaminophen (TYLENOL) 500 MG  tablet Take 1,000 mg by mouth every 6 (six) hours as needed for mild pain.   Yes Historical Provider, MD  amLODipine (NORVASC) 10 MG tablet Take 1 tablet (10 mg total) by mouth daily. 12/27/15  Yes Hoyt Koch, MD  colchicine (COLCRYS) 0.6 MG tablet Take 1 tablet (0.6 mg total) by mouth daily. 12/27/15  Yes Hoyt Koch, MD  cyclobenzaprine (FLEXERIL) 5 MG tablet Take 1 tablet (5 mg total) by mouth 3 (three) times daily as needed for muscle spasms. 01/24/16  Yes Hoyt Koch, MD  lisinopril (PRINIVIL,ZESTRIL) 20 MG tablet Take 1 tablet (20 mg total) by mouth daily. 01/24/16  Yes Hoyt Koch, MD  albuterol (PROVENTIL HFA;VENTOLIN HFA) 108 (90 BASE) MCG/ACT inhaler Inhale 1-2 puffs into the lungs every 6 (six) hours as needed for wheezing. Patient not taking: Reported on 12/27/2015 04/15/15   Tanna Furry, MD  guaiFENesin (MUCINEX) 600 MG 12 hr tablet Take 2 tablets (1,200 mg total) by mouth 2 (two) times daily. 02/04/16   Sandar Krinke Patel-Mills, PA-C   BP 132/85 mmHg  Pulse 87  Temp(Src) 99 F (37.2 C) (Oral)  Resp 16  SpO2 96% Physical Exam  Constitutional: He is oriented to person, place, and time. He appears well-developed and well-nourished. No distress.  HENT:  Head: Normocephalic and atraumatic.  Eyes: Conjunctivae are normal.  Neck: Normal range of motion. Neck supple.  Cardiovascular: Normal rate, regular rhythm and normal heart sounds.   No murmur heard. Regular rate and rhythm.  No murmur.  Lungs: Clear to auscultation bilaterally. No wheezing or decreased breath sounds.  Pulmonary/Chest: Effort normal and breath sounds normal. No respiratory distress. He has no wheezes.  Abdominal: Soft. There is no tenderness.  Abdomen is soft and nontender.   Musculoskeletal: Normal range of motion.  Neurological: He is alert and oriented to person, place, and time.  Skin: Skin is warm and dry.  Nursing note and vitals reviewed.   ED Course  Procedures (including  critical care time) Labs Review Labs Reviewed - No data to display  Imaging Review No results found.    EKG Interpretation None      MDM   Final diagnoses:  URI (upper respiratory infection)   Patient presents for URI symptoms such as headache, cough, congestion, and body aches. Vitals are stable.  Patient took tylenol 1 hour prior to arrival. I do not suspect pneumonia. He is afebrile and otherwise well appearing. Patient was discharged with mucinex.  I explained that it may take 5 days before he feels better.  I discussed return precautions as well as follow up within 48 hours with his pcp.  He can take tylenol or motrin for body aches.  Patient agrees with plan.  Medications  guaiFENesin (MUCINEX) 12 hr tablet 1,200 mg (1,200 mg Oral Given 02/04/16 0702)   Filed Vitals:   02/04/16 0700 02/04/16 0715  BP: 150/93 132/85  Pulse: 94 87  Temp:  99 F (37.2 C)  Resp:  [address] PA-C 02/04/16 [place], DO 02/05/16 [phone]

## 2016-02-04 NOTE — ED Notes (Signed)
Pt stated that he developed a headache, body aches, and fever earlier this morning. Pt stated he took Tylenol, but received no relief of symptoms.

## 2016-02-04 NOTE — Discharge Instructions (Signed)
Upper Respiratory Infection, Adult Stay well hydrated.  Take tylenol or motrin for body aches and mucinex for congestion headache. Follow up with your primary care physician in 2 days.  Return for fever or worsening symptoms.  This may take at least 5 days to get better. Most upper respiratory infections (URIs) are caused by a virus. A URI affects the nose, throat, and upper air passages. The most common type of URI is often called "the common cold." HOME CARE   Take medicines only as told by your doctor.  Gargle warm saltwater or take cough drops to comfort your throat as told by your doctor.  Use a warm mist humidifier or inhale steam from a shower to increase air moisture. This may make it easier to breathe.  Drink enough fluid to keep your pee (urine) clear or pale yellow.  Eat soups and other clear broths.  Have a healthy diet.  Rest as needed.  Go back to work when your fever is gone or your doctor says it is okay.  You may need to stay home longer to avoid giving your URI to others.  You can also wear a face mask and wash your hands often to prevent spread of the virus.  Use your inhaler more if you have asthma.  Do not use any tobacco products, including cigarettes, chewing tobacco, or electronic cigarettes. If you need help quitting, ask your doctor. GET HELP IF:  You are getting worse, not better.  Your symptoms are not helped by medicine.  You have chills.  You are getting more short of breath.  You have brown or red mucus.  You have yellow or brown discharge from your nose.  You have pain in your face, especially when you bend forward.  You have a fever.  You have puffy (swollen) neck glands.  You have pain while swallowing.  You have white areas in the back of your throat. GET HELP RIGHT AWAY IF:   You have very bad or constant:  Headache.  Ear pain.  Pain in your forehead, behind your eyes, and over your cheekbones (sinus pain).  Chest  pain.  You have long-lasting (chronic) lung disease and any of the following:  Wheezing.  Long-lasting cough.  Coughing up blood.  A change in your usual mucus.  You have a stiff neck.  You have changes in your:  Vision.  Hearing.  Thinking.  Mood. MAKE SURE YOU:   Understand these instructions.  Will watch your condition.  Will get help right away if you are not doing well or get worse.   This information is not intended to replace advice given to you by your health care provider. Make sure you discuss any questions you have with your health care provider.   Document Released: 05/28/2008 Document Revised: 04/26/2015 Document Reviewed: 03/17/2014 Elsevier Interactive Patient Education [employer].

## 2016-02-15 ENCOUNTER — Telehealth: Payer: Self-pay | Admitting: Internal Medicine

## 2016-02-15 NOTE — Telephone Encounter (Signed)
Pt called in and has a absence tooth, he wants to know if an antibiotic can be called in for him today?

## 2016-02-16 ENCOUNTER — Other Ambulatory Visit (INDEPENDENT_AMBULATORY_CARE_PROVIDER_SITE_OTHER): Payer: BLUE CROSS/BLUE SHIELD

## 2016-02-16 DIAGNOSIS — I10 Essential (primary) hypertension: Secondary | ICD-10-CM

## 2016-02-16 LAB — COMPREHENSIVE METABOLIC PANEL
ALT: 15 U/L (ref 0–53)
AST: 13 U/L (ref 0–37)
Albumin: 4.2 g/dL (ref 3.5–5.2)
Alkaline Phosphatase: 66 U/L (ref 39–117)
BILIRUBIN TOTAL: 0.2 mg/dL (ref 0.2–1.2)
BUN: 11 mg/dL (ref 6–23)
CALCIUM: 9 mg/dL (ref 8.4–10.5)
CO2: 25 meq/L (ref 19–32)
Chloride: 109 mEq/L (ref 96–112)
Creatinine, Ser: 0.79 mg/dL (ref 0.40–1.50)
GFR: 130.27 mL/min (ref >60.00)
Glucose, Bld: 95 mg/dL (ref 70–99)
POTASSIUM: 4 meq/L (ref 3.5–5.1)
Sodium: 140 mEq/L (ref 135–145)
Total Protein: 7.6 g/dL (ref 6.0–8.3)

## 2016-02-16 MED ORDER — AMOXICILLIN 500 MG PO CAPS: 500.0000 mg | ORAL_CAPSULE | Freq: Three times a day (TID) | ORAL | Status: DC

## 2016-02-16 NOTE — Telephone Encounter (Signed)
Rx for amoxicillin 1 pill TID for 5 days.  Has he started taking his blood pressure medicine lisinopril yet? If not please start. If so remind him that he needs to get his blood work checked at our office.

## 2016-02-16 NOTE — Telephone Encounter (Signed)
Patient will go pick up the amoxicillin. He has started taking lisinopril and he will come in to get his labs drawn today.

## 2016-03-12 ENCOUNTER — Other Ambulatory Visit: Payer: Self-pay | Admitting: Internal Medicine

## 2016-03-12 NOTE — Telephone Encounter (Signed)
Please advise, thanks.

## 2016-04-20 ENCOUNTER — Encounter: Payer: Self-pay | Admitting: Internal Medicine

## 2016-04-20 ENCOUNTER — Ambulatory Visit (INDEPENDENT_AMBULATORY_CARE_PROVIDER_SITE_OTHER): Payer: BLUE CROSS/BLUE SHIELD | Admitting: Internal Medicine

## 2016-04-20 VITALS — BP 126/84 | HR 79 | Temp 98.2°F | Resp 18 | Ht 74.0 in | Wt 266.0 lb

## 2016-04-20 DIAGNOSIS — N521 Erectile dysfunction due to diseases classified elsewhere: Secondary | ICD-10-CM

## 2016-04-20 DIAGNOSIS — I10 Essential (primary) hypertension: Secondary | ICD-10-CM

## 2016-04-20 DIAGNOSIS — Z720 Tobacco use: Secondary | ICD-10-CM | POA: Diagnosis not present

## 2016-04-20 MED ORDER — SILDENAFIL CITRATE 100 MG PO TABS: 50.0000 mg | ORAL_TABLET | Freq: Every day | ORAL | Status: DC | PRN

## 2016-04-20 MED ORDER — LISINOPRIL 20 MG PO TABS: 20.0000 mg | ORAL_TABLET | Freq: Every day | ORAL | Status: DC

## 2016-04-20 MED ORDER — CYCLOBENZAPRINE HCL 5 MG PO TABS: 5.0000 mg | ORAL_TABLET | Freq: Three times a day (TID) | ORAL | Status: DC | PRN

## 2016-04-20 NOTE — Assessment & Plan Note (Signed)
Rx for viagra since BP is now controlled. Will check on referral to urology. Talked to him about the risk of smoking worsening ED and he will strongly think about quitting.

## 2016-04-20 NOTE — Assessment & Plan Note (Signed)
When advised that this could worsen his ED he will quit. Wants to try on his own. Reminded about the risks and harms of cigarette smoke.

## 2016-04-20 NOTE — Patient Instructions (Signed)
We have sent in the viagra for the erectile dysfunction. Smoking can cause worsening of erectile dysfunction and you should stop smoking in order to avoid worse problems.   If you have an erection lasting more than 4 hours seek medical attention as lasting damage can occur for this situation.   We have refilled the blood pressure medicine and sent in a medicine called flexeril for the feet to help with the pain.   Plantar Fasciitis With Rehab The plantar fascia is a fibrous, ligament-like, soft-tissue structure that spans the bottom of the foot. Plantar fasciitis, also called heel spur syndrome, is a condition that causes pain in the foot due to inflammation of the tissue. SYMPTOMS   Pain and tenderness on the underneath side of the foot.  Pain that worsens with standing or walking. CAUSES  Plantar fasciitis is caused by irritation and injury to the plantar fascia on the underneath side of the foot. Common mechanisms of injury include:  Direct trauma to bottom of the foot.  Damage to a small nerve that runs under the foot where the main fascia attaches to the heel bone.  Stress placed on the plantar fascia due to bone spurs. RISK INCREASES WITH:   Activities that place stress on the plantar fascia (running, jumping, pivoting, or cutting).  Poor strength and flexibility.  Improperly fitted shoes.  Tight calf muscles.  Flat feet.  Failure to warm-up properly before activity.  Obesity. PREVENTION  Warm up and stretch properly before activity.  Allow for adequate recovery between workouts.  Maintain physical fitness:  Strength, flexibility, and endurance.  Cardiovascular fitness.  Maintain a health body weight.  Avoid stress on the plantar fascia.  Wear properly fitted shoes, including arch supports for individuals who have flat feet. PROGNOSIS  If treated properly, then the symptoms of plantar fasciitis usually resolve without surgery. However, occasionally surgery  is necessary. RELATED COMPLICATIONS   Recurrent symptoms that may result in a chronic condition.  Problems of the lower back that are caused by compensating for the injury, such as limping.  Pain or weakness of the foot during push-off following surgery.  Chronic inflammation, scarring, and partial or complete fascia tear, occurring more often from repeated injections. TREATMENT  Treatment initially involves the use of ice and medication to help reduce pain and inflammation. The use of strengthening and stretching exercises may help reduce pain with activity, especially stretches of the Achilles tendon. These exercises may be performed at home or with a therapist. Your caregiver may recommend that you use heel cups of arch supports to help reduce stress on the plantar fascia. Occasionally, corticosteroid injections are given to reduce inflammation. If symptoms persist for greater than 6 months despite non-surgical (conservative), then surgery may be recommended.  MEDICATION   If pain medication is necessary, then nonsteroidal anti-inflammatory medications, such as aspirin and ibuprofen, or other minor pain relievers, such as acetaminophen, are often recommended.  Do not take pain medication within 7 days before surgery.  Prescription pain relievers may be given if deemed necessary by your caregiver. Use only as directed and only as much as you need.  Corticosteroid injections may be given by your caregiver. These injections should be reserved for the most serious cases, because they may only be given a certain number of times. HEAT AND COLD  Cold treatment (icing) relieves pain and reduces inflammation. Cold treatment should be applied for 10 to 15 minutes every 2 to 3 hours for inflammation and pain and immediately after  any activity that aggravates your symptoms. Use ice packs or massage the area with a piece of ice (ice massage).  Heat treatment may be used prior to performing the  stretching and strengthening activities prescribed by your caregiver, physical therapist, or athletic trainer. Use a heat pack or soak the injury in warm water. SEEK IMMEDIATE MEDICAL CARE IF:  Treatment seems to offer no benefit, or the condition worsens.  Any medications produce adverse side effects. EXERCISES RANGE OF MOTION (ROM) AND STRETCHING EXERCISES - Plantar Fasciitis (Heel Spur Syndrome) These exercises may help you when beginning to rehabilitate your injury. Your symptoms may resolve with or without further involvement from your physician, physical therapist or athletic trainer. While completing these exercises, remember:   Restoring tissue flexibility helps normal motion to return to the joints. This allows healthier, less painful movement and activity.  An effective stretch should be held for at least 30 seconds.  A stretch should never be painful. You should only feel a gentle lengthening or release in the stretched tissue. RANGE OF MOTION - Toe Extension, Flexion  Sit with your right / left leg crossed over your opposite knee.  Grasp your toes and gently pull them back toward the top of your foot. You should feel a stretch on the bottom of your toes and/or foot.  Hold this stretch for __________ seconds.  Now, gently pull your toes toward the bottom of your foot. You should feel a stretch on the top of your toes and or foot.  Hold this stretch for __________ seconds. Repeat __________ times. Complete this stretch __________ times per day.  RANGE OF MOTION - Ankle Dorsiflexion, Active Assisted  Remove shoes and sit on a chair that is preferably not on a carpeted surface.  Place right / left foot under knee. Extend your opposite leg for support.  Keeping your heel down, slide your right / left foot back toward the chair until you feel a stretch at your ankle or calf. If you do not feel a stretch, slide your bottom forward to the edge of the chair, while still keeping  your heel down.  Hold this stretch for __________ seconds. Repeat __________ times. Complete this stretch __________ times per day.  STRETCH - Gastroc, Standing  Place hands on wall.  Extend right / left leg, keeping the front knee somewhat bent.  Slightly point your toes inward on your back foot.  Keeping your right / left heel on the floor and your knee straight, shift your weight toward the wall, not allowing your back to arch.  You should feel a gentle stretch in the right / left calf. Hold this position for __________ seconds. Repeat __________ times. Complete this stretch __________ times per day. STRETCH - Soleus, Standing  Place hands on wall.  Extend right / left leg, keeping the other knee somewhat bent.  Slightly point your toes inward on your back foot.  Keep your right / left heel on the floor, bend your back knee, and slightly shift your weight over the back leg so that you feel a gentle stretch deep in your back calf.  Hold this position for __________ seconds. Repeat __________ times. Complete this stretch __________ times per day. STRETCH - Gastrocsoleus, Standing  Note: This exercise can place a lot of stress on your foot and ankle. Please complete this exercise only if specifically instructed by your caregiver.   Place the ball of your right / left foot on a step, keeping your other foot firmly  on the same step.  Hold on to the wall or a rail for balance.  Slowly lift your other foot, allowing your body weight to press your heel down over the edge of the step.  You should feel a stretch in your right / left calf.  Hold this position for __________ seconds.  Repeat this exercise with a slight bend in your right / left knee. Repeat __________ times. Complete this stretch __________ times per day.  STRENGTHENING EXERCISES - Plantar Fasciitis (Heel Spur Syndrome)  These exercises may help you when beginning to rehabilitate your injury. They may resolve your  symptoms with or without further involvement from your physician, physical therapist or athletic trainer. While completing these exercises, remember:   Muscles can gain both the endurance and the strength needed for everyday activities through controlled exercises.  Complete these exercises as instructed by your physician, physical therapist or athletic trainer. Progress the resistance and repetitions only as guided. STRENGTH - Towel Curls  Sit in a chair positioned on a non-carpeted surface.  Place your foot on a towel, keeping your heel on the floor.  Pull the towel toward your heel by only curling your toes. Keep your heel on the floor.  If instructed by your physician, physical therapist or athletic trainer, add ____________________ at the end of the towel. Repeat __________ times. Complete this exercise __________ times per day. STRENGTH - Ankle Inversion  Secure one end of a rubber exercise band/tubing to a fixed object (table, pole). Loop the other end around your foot just before your toes.  Place your fists between your knees. This will focus your strengthening at your ankle.  Slowly, pull your big toe up and in, making sure the band/tubing is positioned to resist the entire motion.  Hold this position for __________ seconds.  Have your muscles resist the band/tubing as it slowly pulls your foot back to the starting position. Repeat __________ times. Complete this exercises __________ times per day.    This information is not intended to replace advice given to you by your health care provider. Make sure you discuss any questions you have with your health care provider.   Document Released: 12/10/2005 Document Revised: 04/26/2015 Document Reviewed: 03/24/2009 Elsevier Interactive Patient Education [employer].

## 2016-04-20 NOTE — Assessment & Plan Note (Signed)
BP at goal on lisinopril and BMP after starting therapy is without indication for change. Continue.

## 2016-04-20 NOTE — Progress Notes (Signed)
Pre visit review using our clinic review tool, if applicable. No additional management support is needed unless otherwise documented below in the visit note. 

## 2016-04-20 NOTE — Progress Notes (Signed)
   Subjective:    Patient ID: Devin [name], male    DOB: 09-Feb-1961, 55 y.o.   MRN: [id number]  HPI The patient is a 55 YO man coming in for ED (never got in with urology, has not tried anything for it, not able to sustain erections) and his blood pressure (taking lisinopril now, no side effects, not complicated) and his smoking (still smoking, wants to quit). No new concerns.   Review of Systems  Constitutional: Negative for fever, chills, activity change, appetite change, fatigue and unexpected weight change.  HENT: Negative.   Eyes: Negative.   Respiratory: Negative for cough, chest tightness, shortness of breath and wheezing.   Cardiovascular: Negative for chest pain, palpitations and leg swelling.  Gastrointestinal: Negative for nausea, abdominal pain, diarrhea, constipation and abdominal distention.  Genitourinary:       ED  Musculoskeletal: Positive for arthralgias. Negative for myalgias, back pain and gait problem.  Skin: Negative.   Neurological: Negative for dizziness, weakness, light-headedness, numbness and headaches.  Psychiatric/Behavioral: Negative.       Objective:   Physical Exam  Constitutional: He is oriented to person, place, and time. He appears well-developed and well-nourished.  Overweight  HENT:  Head: Normocephalic and atraumatic.  Eyes: EOM are normal.  Neck: Normal range of motion.  Cardiovascular: Normal rate and regular rhythm.   Pulmonary/Chest: Effort normal and breath sounds normal. No respiratory distress. He has no wheezes. He has no rales.  Abdominal: Soft. Bowel sounds are normal. He exhibits no distension. There is no tenderness. There is no rebound.  Musculoskeletal: He exhibits no edema.  Neurological: He is alert and oriented to person, place, and time. Coordination normal.  Skin: Skin is warm and dry.  Psychiatric: He has a normal mood and affect.   Filed Vitals:   04/20/16 1303  BP: 126/84  Pulse: 79  Temp: 98.2 F (36.8 C)    TempSrc: Oral  Resp: 18  Height: 6\' 2"  (1.88 m)  Weight: 266 lb (120.657 kg)  SpO2: 96%      Assessment & Plan:

## 2016-06-06 ENCOUNTER — Ambulatory Visit: Payer: BLUE CROSS/BLUE SHIELD | Admitting: Family

## 2016-06-07 ENCOUNTER — Ambulatory Visit (INDEPENDENT_AMBULATORY_CARE_PROVIDER_SITE_OTHER): Payer: BLUE CROSS/BLUE SHIELD | Admitting: Family

## 2016-06-07 ENCOUNTER — Telehealth: Payer: Self-pay

## 2016-06-07 ENCOUNTER — Encounter: Payer: Self-pay | Admitting: Family

## 2016-06-07 VITALS — BP 130/78 | HR 86 | Temp 98.1°F | Ht 74.0 in | Wt 265.8 lb

## 2016-06-07 DIAGNOSIS — R21 Rash and other nonspecific skin eruption: Secondary | ICD-10-CM

## 2016-06-07 MED ORDER — CLOTRIMAZOLE 1 % EX CREA: 1.0000 "application " | TOPICAL_CREAM | Freq: Two times a day (BID) | CUTANEOUS | Status: DC

## 2016-06-07 NOTE — Telephone Encounter (Signed)
The medication that you sent in today for his visit. Patient states the medication cost to much and could you send something else in that is cheaper. Thank you

## 2016-06-07 NOTE — Progress Notes (Signed)
Subjective:    Patient ID: Devin [name], male    DOB: 17-May-1961, 55 y.o.   MRN: [id number]   Devin [name] is a 55 y.o. male who presents today for an acute visit.    HPI Comments: Patient here for evaluation of rash on face and neck for one month. Started with one spot on his forehead and have spread to cheeks and bilateral inner aspects or arms.  He describes as spots as "white spots". Dot not itch. No drainage, fever, chills. No h/o autoimmune disease.   Past Medical History  Diagnosis Date  . Hypertension   . Gout    Allergies: Review of patient's allergies indicates no known allergies. Current Outpatient Prescriptions on File Prior to Visit  Medication Sig Dispense Refill  . cyclobenzaprine (FLEXERIL) 5 MG tablet Take 1 tablet (5 mg total) by mouth 3 (three) times daily as needed for muscle spasms. 30 tablet 1  . lisinopril (PRINIVIL,ZESTRIL) 20 MG tablet Take 1 tablet (20 mg total) by mouth daily. 90 tablet 3  . sildenafil (VIAGRA) 100 MG tablet Take 0.5-1 tablets (50-100 mg total) by mouth daily as needed for erectile dysfunction. (Patient not taking: Reported on 06/07/2016) 15 tablet 11   No current facility-administered medications on file prior to visit.    Social History  Substance Use Topics  . Smoking status: Current Every Day Smoker -- 0.50 packs/day    Types: Cigarettes  . Smokeless tobacco: None  . Alcohol Use: Yes    Review of Systems  Constitutional: Negative for fever and chills.  Respiratory: Negative for cough.   Cardiovascular: Negative for chest pain and palpitations.  Gastrointestinal: Negative for nausea and vomiting.  Skin: Positive for rash. Negative for wound.      Objective:    BP 130/78 mmHg  Pulse 86  Temp(Src) 98.1 F (36.7 C) (Oral)  Ht 6\' 2"  (1.88 m)  Wt 265 lb 12 oz (120.543 kg)  BMI 34.11 kg/m2  SpO2 96%   Physical Exam  Constitutional: He appears well-developed and well-nourished.  Cardiovascular: Regular rhythm and normal  heart sounds.   Pulmonary/Chest: Effort normal and breath sounds normal. No respiratory distress. He has no wheezes. He has no rhonchi. He has no rales.  Lymphadenopathy:       Head (left side): No submandibular and no preauricular adenopathy present.  Neurological: He is alert.  Skin: Skin is warm and dry. Rash noted. Rash is macular.  Hypopigmented discrete macules varying approx 1-3cm in diameter over brow, hairline, cheeks of face. One macule noted ventral aspect right forearm. No erythema, drainage, streaking, increased warmth.    Psychiatric: He has a normal mood and affect. His speech is normal and behavior is normal.  Vitals reviewed.      Assessment & Plan:  1. Rash and nonspecific skin eruption Working diagnosis of tinea versicolor supported by hypopigmented skin lesions which started on face. Low clinical suspicion for vitiligo as lesions are not depigmented. However I discussed this alternate diagnosis with patient; we jointly agreed to trial of antifungal topical cream, if does not improve,   he will let us know and I will place a referral to dermatology.  - clotrimazole (LOTRIMIN) 1 % cream; Apply 1 application topically 2 (two) times daily.  Dispense: 30 g; Refill: 1     I am having Devin [name] start on clotrimazole. I am also having him maintain his lisinopril, sildenafil, cyclobenzaprine, and amLODipine.   Meds ordered this encounter  Medications  .  amLODipine (NORVASC) 10 MG tablet    Sig: Take 10 mg by mouth daily.    Refill:  3  . clotrimazole (LOTRIMIN) 1 % cream    Sig: Apply 1 application topically 2 (two) times daily.    Dispense:  30 g    Refill:  1    Order Specific Question:  Supervising Provider    Answer:  Cassandria Anger [[id number]]     Start medications as prescribed and explained to patient on After Visit Summary ( AVS). Risks, benefits, and alternatives of the medications and treatment plan prescribed today were discussed, and patient expressed  understanding.   Education regarding symptom management and diagnosis given to patient.   Follow-up:Plan follow-up and return precautions given if any worsening symptoms or change in condition.   Continue to follow with Hoyt Koch, MD for routine health maintenance.   Julious Payer and I agreed with plan.   Mable Paris, FNP

## 2016-06-07 NOTE — Progress Notes (Signed)
Pre visit review using our clinic review tool, if applicable. No additional management support is needed unless otherwise documented below in the visit note. 

## 2016-06-07 NOTE — Patient Instructions (Signed)
Suspect tinea versicolor ( yeast infection).   It doesn't improve on trial antifungal, we will place referral to dermatology for you.  If there is no improvement in your symptoms, or if there is any worsening of symptoms, or if you have any additional concerns, please return for re-evaluation; or, if we are closed, consider going to the Emergency Room for evaluation if symptoms urgent.  Tinea Versicolor Tinea versicolor is a common fungal infection of the skin. It causes a rash that appears as light or dark patches on the skin. The rash most often occurs on the chest, back, neck, or upper arms. This condition is more common during warm weather. Other than affecting how your skin looks, tinea versicolor usually does not cause other problems. In most cases, the infection goes away in a few weeks with treatment. It may take a few months for the patches on your skin to clear up. CAUSES Tinea versicolor occurs when a type of fungus that is normally present on the skin starts to overgrow. This fungus is a kind of yeast. The exact cause of the overgrowth is not known. This condition cannot be passed from one person to another (noncontagious). RISK FACTORS This condition is more likely to develop when certain factors are present, such as:  Heat and humidity.  Sweating too much.  Hormone changes.  Oily skin.  A weak defense (immune) system. SYMPTOMS Symptoms of this condition may include:  A rash on your skin that is made up of light or dark patches. The rash may have:  Patches of tan or pink spots on light skin.  Patches of white or brown spots on dark skin.  Patches of skin that do not tan.  Well-marked edges.  Scales on the discolored areas.  Mild itching. DIAGNOSIS A health care provider can usually diagnose this condition by looking at your skin. During the exam, he or she may use ultraviolet light to help determine the extent of the infection. In some cases, a skin sample may be  taken by scraping the rash. This sample will be viewed under a microscope to check for yeast overgrowth. TREATMENT Treatment for this condition may include:  Dandruff shampoo that is applied to the affected skin during showers or bathing.  Over-the-counter medicated skin cream, lotion, or soaps.  Prescription antifungal medicine in the form of skin cream or pills.  Medicine to help reduce itching. HOME CARE INSTRUCTIONS  Take medicines only as directed by your health care provider.  Apply dandruff shampoo to the affected area if told to do so by your health care provider. You may be instructed to scrub the affected skin for several minutes each day.  Do not scratch the affected area of skin.  Avoid hot and humid conditions.  Do not use tanning booths.  Try to avoid sweating a lot. SEEK MEDICAL CARE IF:  Your symptoms get worse.  You have a fever.  You have redness, swelling, or pain at the site of your rash.  You have fluid, blood, or pus coming from your rash.  Your rash returns after treatment.   This information is not intended to replace advice given to you by your health care provider. Make sure you discuss any questions you have with your health care provider.   Document Released: 12/07/2000 Document Revised: 12/31/2014 Document Reviewed: 09/21/2014 Elsevier Interactive Patient Education [employer].

## 2016-06-08 NOTE — Telephone Encounter (Signed)
Spoke to pharmacy - problem fixed and they are calling patient to have him pick up med  Thanks !

## 2016-06-08 NOTE — Telephone Encounter (Signed)
LVM for pt to call back as soon as possible.   I will send to [facility] for advise.

## 2016-06-08 NOTE — Telephone Encounter (Signed)
Insurance will not cover clotrimazole and he can't afford it. Can you send in a replacement?

## 2016-06-08 NOTE — Telephone Encounter (Signed)
Patient call again about this. Can you please follow up. Thank you

## 2016-07-17 ENCOUNTER — Ambulatory Visit (INDEPENDENT_AMBULATORY_CARE_PROVIDER_SITE_OTHER): Payer: BLUE CROSS/BLUE SHIELD | Admitting: Internal Medicine

## 2016-07-17 ENCOUNTER — Encounter: Payer: Self-pay | Admitting: Internal Medicine

## 2016-07-17 VITALS — BP 138/100 | HR 89 | Temp 98.6°F | Resp 14 | Ht 74.0 in | Wt 269.0 lb

## 2016-07-17 DIAGNOSIS — R21 Rash and other nonspecific skin eruption: Secondary | ICD-10-CM | POA: Diagnosis not present

## 2016-07-17 DIAGNOSIS — M79671 Pain in right foot: Secondary | ICD-10-CM | POA: Diagnosis not present

## 2016-07-17 DIAGNOSIS — I10 Essential (primary) hypertension: Secondary | ICD-10-CM | POA: Diagnosis not present

## 2016-07-17 DIAGNOSIS — M79672 Pain in left foot: Secondary | ICD-10-CM

## 2016-07-17 MED ORDER — TRIAMCINOLONE ACETONIDE 0.1 % EX CREA: 1.0000 "application " | TOPICAL_CREAM | Freq: Two times a day (BID) | CUTANEOUS | 1 refills | Status: DC

## 2016-07-17 MED ORDER — DICLOFENAC SODIUM 75 MG PO TBEC: 75.0000 mg | DELAYED_RELEASE_TABLET | Freq: Two times a day (BID) | ORAL | 1 refills | Status: DC

## 2016-07-17 NOTE — Progress Notes (Signed)
   Subjective:    Patient ID: Devin [name], male    DOB: 1961-11-10, 55 y.o.   MRN: [id number]  HPI The patient is a 55 YO man coming in for follow up of his blood pressure (doing well on his amlodipine and lisinopril, did not take his meds this morning, overall controlled at home) and he is also having a rash for the last month or so and was given some cream last time he was in to see the NP and the cream did not help at all. It started on his face and is now on his arms as well. The spots do not itch and do not hurt. They are lighter colored than his skin tone and this is bothersome to him as people are asking about it.  He is also having foot pain which is worse than last time. Hurt most of the time and all day long. Worse with walking or activity. Some mild swelling. No new injury to them. No weakness. Worse throughout the day. Has not tried anything for them at home.   Review of Systems  Constitutional: Negative for activity change, appetite change, chills, fatigue, fever and unexpected weight change.  Respiratory: Negative for cough, chest tightness, shortness of breath and wheezing.   Cardiovascular: Negative for chest pain, palpitations and leg swelling.  Gastrointestinal: Negative for abdominal distention, abdominal pain, constipation, diarrhea and nausea.  Musculoskeletal: Positive for arthralgias. Negative for back pain, gait problem and myalgias.  Skin: Positive for color change and rash.  Neurological: Negative for dizziness, weakness, light-headedness, numbness and headaches.      Objective:   Physical Exam  Constitutional: He is oriented to person, place, and time. He appears well-developed and well-nourished.  Overweight  HENT:  Head: Normocephalic and atraumatic.  Eyes: EOM are normal.  Neck: Normal range of motion.  Cardiovascular: Normal rate and regular rhythm.   Pulmonary/Chest: Effort normal and breath sounds normal. No respiratory distress. He has no wheezes. He has  no rales.  Abdominal: Soft. He exhibits no distension. There is no tenderness. There is no rebound.  Musculoskeletal: He exhibits no edema.  Neurological: He is alert and oriented to person, place, and time. Coordination normal.  Skin: Skin is warm and dry.  Some spots of decreased color on the face and arms, no hair loss or circular lesions.    Vitals:   07/17/16 0812 07/17/16 0833  BP: (!) 136/102 (!) 138/100  Pulse: 89   Resp: 14   Temp: 98.6 F (37 C)   TempSrc: Oral   SpO2: 97%   Weight: 269 lb (122 kg)   Height: 6\' 2"  (1.88 m)       Assessment & Plan:

## 2016-07-17 NOTE — Assessment & Plan Note (Signed)
Rx for triamcinolone cream. He did not have any change with clotrimazole cream. If no change needs to see dermatology.

## 2016-07-17 NOTE — Assessment & Plan Note (Signed)
BP above goal today and will continue to take his amlodipine and lisinopril. BP at goal at home and prior visits. He will also work on [employer].

## 2016-07-17 NOTE — Patient Instructions (Addendum)
We have sent in triamcinolone cream for the face and the arms. Use the cream daily on the face and twice a day on the other spots.   We have sent in voltaren for the feet and also will get you in with the foot doctor to fix the problem. Take 1 pill twice a day of the voltaren for pain. It is okay to take tylenol with this but do not take ibuprofen or naproxen (aleve or motrin) with it as they are similar medicines.

## 2016-07-17 NOTE — Assessment & Plan Note (Signed)
Rx for voltaren for pain. Referral to podiatry for evaluation.

## 2016-07-17 NOTE — Progress Notes (Signed)
Pre visit review using our clinic review tool, if applicable. No additional management support is needed unless otherwise documented below in the visit note. 

## 2016-07-19 ENCOUNTER — Telehealth: Payer: Self-pay | Admitting: Internal Medicine

## 2016-07-19 DIAGNOSIS — Z1211 Encounter for screening for malignant neoplasm of colon: Secondary | ICD-10-CM

## 2016-07-19 DIAGNOSIS — Z1159 Encounter for screening for other viral diseases: Secondary | ICD-10-CM

## 2016-07-19 NOTE — Telephone Encounter (Signed)
Patient sent in a mychart message asking that we place a referral for gastro for colonoscopy and a lab test for hep c. Is this ok?

## 2016-07-19 NOTE — Telephone Encounter (Signed)
Placed GI referral and can come to the lab for the hep c screening anytime.

## 2016-07-23 ENCOUNTER — Telehealth: Payer: Self-pay | Admitting: Internal Medicine

## 2016-07-23 NOTE — Telephone Encounter (Signed)
He does not need a referral and can call any dermatologist he wishes.

## 2016-07-23 NOTE — Telephone Encounter (Signed)
Patient called to advise that the medication given for his skin hasn't worked at all. He is asking for a referral to a skin specialist asap.

## 2016-07-24 NOTE — Telephone Encounter (Signed)
Advised patient of dr crawfords note, patient to come to lab for hep c

## 2016-07-25 NOTE — Telephone Encounter (Signed)
Called patient to advise  °

## 2016-08-09 ENCOUNTER — Encounter: Payer: Self-pay | Admitting: Podiatry

## 2016-08-09 ENCOUNTER — Ambulatory Visit (INDEPENDENT_AMBULATORY_CARE_PROVIDER_SITE_OTHER): Payer: BLUE CROSS/BLUE SHIELD

## 2016-08-09 ENCOUNTER — Ambulatory Visit (INDEPENDENT_AMBULATORY_CARE_PROVIDER_SITE_OTHER): Payer: BLUE CROSS/BLUE SHIELD | Admitting: Podiatry

## 2016-08-09 VITALS — BP 144/87 | HR 98 | Resp 16

## 2016-08-09 DIAGNOSIS — M79673 Pain in unspecified foot: Secondary | ICD-10-CM | POA: Diagnosis not present

## 2016-08-09 DIAGNOSIS — M722 Plantar fascial fibromatosis: Secondary | ICD-10-CM | POA: Diagnosis not present

## 2016-08-09 MED ORDER — METHYLPREDNISOLONE 4 MG PO TBPK: ORAL_TABLET | ORAL | 0 refills | Status: DC

## 2016-08-09 NOTE — Patient Instructions (Signed)

## 2016-08-09 NOTE — Progress Notes (Signed)
   Subjective:    Patient ID: Devin [name], male    DOB: 17-Feb-1961, 55 y.o.   MRN: [id number]  HPI: He presents today with a chief complaint of pain to the medial and lateral aspects of the bilateral foot he states that his hurts around his big toe lateral aspect of the foot and into his heel. 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 He takes Tylenol which really does not seem to help.    Review of Systems  Musculoskeletal: Positive for arthralgias and back pain.  All other systems reviewed and are negative.      Objective:   Physical Exam: Vital signs are stable he is alert and oriented 3. Pulses are strongly palpable. Neurologic sensorium is intact. Deep tendon reflexes are intact. Muscle strength +5 over 5 dorsiflexion plantar flexors and inverters everters all intrinsic musculature is intact. Orthopedic evaluation demonstrates all joints distal to the ankle for range of motion without crepitation. He has pain on palpation medial calcaneal tubercles bilateral foot. He has pain on range of motion and on palpation of the first metatarsophalangeal joints bilaterally. Radiographs 3 views bilaterally taken today do demonstrate soft tissue increase in density at the plantar fascia calcaneal insertion site consistent with plantar fasciitis as well as joint space narrowing dorsal spurring subchondral sclerosis of the first metatarsophalangeal joints consistent with hallux limitus and osteoarthritic changes. Cutaneous evaluation demonstrates supple well-hydrated cutis no erythema edema cellulitis drainage or odor.        Assessment & Plan:  Hallux limitus first metatarsophalangeal joints bilateral. Plantar fasciitis bilateral. Lateral compensatory syndrome bilateral.  Plan: Discussed etiology pathology conservative versus surgical therapies. Placed him on a  Medrol Dosepak to be followed by meloxicam. Injected the bilateral heels today with Kenalog and local anesthetic placed and plantar fascia braces and an single night splint. We discussed appropriate shoe gear stretching exercises ice therapy and sugar modifications. He was provided with both oral and written home-going instructions for stretching exercises. I will follow-up with him in 1 month at which time we may need to consider surgical options regarding the first metatarsophalangeal joint.

## 2016-08-17 ENCOUNTER — Encounter: Payer: Self-pay | Admitting: Internal Medicine

## 2016-08-28 ENCOUNTER — Other Ambulatory Visit: Payer: Self-pay | Admitting: Internal Medicine

## 2016-09-06 ENCOUNTER — Ambulatory Visit (INDEPENDENT_AMBULATORY_CARE_PROVIDER_SITE_OTHER): Payer: BLUE CROSS/BLUE SHIELD | Admitting: Podiatry

## 2016-09-06 DIAGNOSIS — M722 Plantar fascial fibromatosis: Secondary | ICD-10-CM

## 2016-09-06 MED ORDER — CELECOXIB 200 MG PO CAPS: 200.0000 mg | ORAL_CAPSULE | Freq: Two times a day (BID) | ORAL | 3 refills | Status: DC

## 2016-09-08 NOTE — Progress Notes (Signed)
He presents today for follow-up of his plantar fasciitis and pick up his orthotics.  Objective: He states that his heels are still painful. He has pain on palpation medial trochanter was bilateral.  Assessment: Fasciitis bilateral.  Plan: Started him on Celebrex and dispenses orthotics today will follow up with him in 1 month or so. We did discuss appropriate shoe gear stretching exercises ice therapy and shoe modifications and the appropriate shoe gear for the use of the orthotics.

## 2016-10-03 ENCOUNTER — Other Ambulatory Visit: Payer: Self-pay | Admitting: Internal Medicine

## 2016-10-04 ENCOUNTER — Ambulatory Visit (INDEPENDENT_AMBULATORY_CARE_PROVIDER_SITE_OTHER): Payer: BLUE CROSS/BLUE SHIELD | Admitting: Podiatry

## 2016-10-04 DIAGNOSIS — M722 Plantar fascial fibromatosis: Secondary | ICD-10-CM | POA: Diagnosis not present

## 2016-10-04 NOTE — Progress Notes (Signed)
He presents today for follow-up of plantar fasciitis. He states that he is doing quite well and doesn't feel that he needs any further injections.  Objective: Vital signs are stable he is alert and oriented 3 pulses are palpable. He has no pain no reproducible pain on palpation medially continue to cause bilateral. No open lesions or wounds.  Assessment: Well-healing plantar fasciitis bilateral. Follow up with me on an as-needed basis.

## 2016-10-05 ENCOUNTER — Telehealth: Payer: Self-pay | Admitting: *Deleted

## 2016-10-05 MED ORDER — CELECOXIB 200 MG PO CAPS: 200.0000 mg | ORAL_CAPSULE | Freq: Two times a day (BID) | ORAL | 1 refills | Status: DC

## 2016-10-05 NOTE — Telephone Encounter (Signed)
Received fax request for #90 Celecoxib 200mg . Dr. Marta Antu #90 +1 refill. Escribed to [id number].

## 2016-10-08 ENCOUNTER — Ambulatory Visit: Payer: BLUE CROSS/BLUE SHIELD | Admitting: *Deleted

## 2016-10-08 VITALS — Ht 74.0 in | Wt 269.8 lb

## 2016-10-08 DIAGNOSIS — Z1211 Encounter for screening for malignant neoplasm of colon: Secondary | ICD-10-CM

## 2016-10-08 MED ORDER — NA SULFATE-K SULFATE-MG SULF 17.5-3.13-1.6 GM/177ML PO SOLN: 1.0000 | Freq: Once | ORAL | 0 refills | Status: AC

## 2016-10-08 NOTE — Progress Notes (Signed)
Denies allergies to eggs or soy products. Denies complications with sedation or anesthesia. Denies O2 use. Denies use of diet or weight loss medications.  Emmi instructions given for colonoscopy.  

## 2016-10-09 ENCOUNTER — Telehealth: Payer: Self-pay | Admitting: Internal Medicine

## 2016-10-09 NOTE — Telephone Encounter (Signed)
Returned call to patient, patient went to pharmacy but did not take coupon for no more than $50.00, was advised by pharmacy to call and ask for something else. I instructed pt to take the coupon with him to pharmacy and instructions were on the sheet to the pharmacist for billing. Pt would pay no more than 50.00 and could possibly be less.  Pt understands and is returning to the pharmacy this afternoon.

## 2016-10-11 ENCOUNTER — Encounter: Payer: Self-pay | Admitting: Internal Medicine

## 2016-10-17 ENCOUNTER — Telehealth: Payer: Self-pay | Admitting: Internal Medicine

## 2016-10-17 NOTE — Telephone Encounter (Signed)
Called pharmacy and gave them the coupon for the patient to pay no more than 50 dollars and pharmacist filled rx. Left a message for patient to return my call.

## 2016-10-18 NOTE — Telephone Encounter (Signed)
Left a message for patient to return my call. 

## 2016-10-22 ENCOUNTER — Encounter: Payer: Self-pay | Admitting: Internal Medicine

## 2016-10-22 ENCOUNTER — Ambulatory Visit (AMBULATORY_SURGERY_CENTER): Payer: BLUE CROSS/BLUE SHIELD | Admitting: Internal Medicine

## 2016-10-22 VITALS — BP 108/66 | HR 70 | Temp 95.5°F | Resp 15 | Ht 74.0 in | Wt 269.0 lb

## 2016-10-22 DIAGNOSIS — Z1211 Encounter for screening for malignant neoplasm of colon: Secondary | ICD-10-CM

## 2016-10-22 DIAGNOSIS — D125 Benign neoplasm of sigmoid colon: Secondary | ICD-10-CM | POA: Diagnosis not present

## 2016-10-22 DIAGNOSIS — Z1212 Encounter for screening for malignant neoplasm of rectum: Secondary | ICD-10-CM | POA: Diagnosis not present

## 2016-10-22 DIAGNOSIS — D128 Benign neoplasm of rectum: Secondary | ICD-10-CM

## 2016-10-22 DIAGNOSIS — K621 Rectal polyp: Secondary | ICD-10-CM

## 2016-10-22 DIAGNOSIS — D129 Benign neoplasm of anus and anal canal: Secondary | ICD-10-CM

## 2016-10-22 MED ORDER — SODIUM CHLORIDE 0.9 % IV SOLN: 500.0000 mL | INTRAVENOUS | Status: DC

## 2016-10-22 NOTE — Progress Notes (Signed)
Report given to PACU RN, vss 

## 2016-10-22 NOTE — Patient Instructions (Signed)
YOU HAD AN ENDOSCOPIC PROCEDURE TODAY AT THE [facility]:   Refer to the procedure report that was given to you for any specific questions about what was found during the examination.  If the procedure report does not answer your questions, please call your gastroenterologist to clarify.  If you requested that your care partner not be given the details of your procedure findings, then the procedure report has been included in a sealed envelope for you to review at your convenience later.  YOU SHOULD EXPECT: Some feelings of bloating in the abdomen. Passage of more gas than usual.  Walking can help get rid of the air that was put into your GI tract during the procedure and reduce the bloating. If you had a lower endoscopy (such as a colonoscopy or flexible sigmoidoscopy) you may notice spotting of blood in your stool or on the toilet paper. If you underwent a bowel prep for your procedure, you may not have a normal bowel movement for a few days.  Please Note:  You might notice some irritation and congestion in your nose or some drainage.  This is from the oxygen used during your procedure.  There is no need for concern and it should clear up in a day or so.  SYMPTOMS TO REPORT IMMEDIATELY:   Following lower endoscopy (colonoscopy or flexible sigmoidoscopy):  Excessive amounts of blood in the stool  Significant tenderness or worsening of abdominal pains  Swelling of the abdomen that is new, acute  Fever of 100F or higher    For urgent or emergent issues, a gastroenterologist can be reached at any hour by calling [phone].   DIET:  We do recommend a small meal at first, but then you may proceed to your regular diet.  Drink plenty of fluids but you should avoid alcoholic beverages for 24 hours.  ACTIVITY:  You should plan to take it easy for the rest of today and you should NOT DRIVE or use heavy machinery until tomorrow (because of the sedation medicines used during the test).     FOLLOW UP: Our staff will call the number listed on your records the next business day following your procedure to check on you and address any questions or concerns that you may have regarding the information given to you following your procedure. If we do not reach you, we will leave a message.  However, if you are feeling well and you are not experiencing any problems, there is no need to return our call.  We will assume that you have returned to your regular daily activities without incident.  If any biopsies were taken you will be contacted by phone or by letter within the next 1-3 weeks.  Please call us at [phone] if you have not heard about the biopsies in 3 weeks.    SIGNATURES/CONFIDENTIALITY: You and/or your care partner have signed paperwork which will be entered into your electronic medical record.  These signatures attest to the fact that that the information above on your After Visit Summary has been reviewed and is understood.  Full responsibility of the confidentiality of this discharge information lies with you and/or your care-partner.   Resume medications. Information given on polyps and hemorrhoids. 

## 2016-10-22 NOTE — Op Note (Signed)
[facility] Patient Name: Devin [name] Procedure Date: 10/22/2016 10:27 AM MRN: [id number] Endoscopist: Jerene Bears , MD Age: 55 Referring MD:  Date of Birth: 08/01/1961 Gender: Male Account #: [id number] Procedure:                Colonoscopy Indications:              Screening for colorectal malignant neoplasm Medicines:                Monitored Anesthesia Care Procedure:                Pre-Anesthesia Assessment:                           - Prior to the procedure, a History and Physical                            was performed, and patient medications and                            allergies were reviewed. The patient's tolerance of                            previous anesthesia was also reviewed. The risks                            and benefits of the procedure and the sedation                            options and risks were discussed with the patient.                            All questions were answered, and informed consent                            was obtained. Prior Anticoagulants: The patient has                            taken no previous anticoagulant or antiplatelet                            agents. ASA Grade Assessment: II - A patient with                            mild systemic disease. After reviewing the risks                            and benefits, the patient was deemed in                            satisfactory condition to undergo the procedure.                           After obtaining informed consent, the colonoscope  was passed under direct vision. Throughout the                            procedure, the patient's blood pressure, pulse, and                            oxygen saturations were monitored continuously. The                            Model CF-HQ190L [id number]) scope was introduced                            through the anus and advanced to the the cecum,                            identified by  appendiceal orifice and ileocecal                            valve. The colonoscopy was performed without                            difficulty. The patient tolerated the procedure                            well. The quality of the bowel preparation was                            good. The ileocecal valve, appendiceal orifice, and                            rectum were photographed. Scope In: 11:10:12 AM Scope Out: 11:23:57 AM Scope Withdrawal Time: 0 hours 12 minutes 0 seconds  Total Procedure Duration: 0 hours 13 minutes 45 seconds  Findings:                 The digital rectal exam was normal.                           A 3 mm polyp was found in the sigmoid colon. The                            polyp was sessile. The polyp was removed with a                            cold biopsy forceps. Resection and retrieval were                            complete.                           A 5 mm polyp was found in the rectum. The polyp was                            sessile. The polyp was removed with a  cold snare.                            Resection and retrieval were complete.                           Internal hemorrhoids were found during                            retroflexion. The hemorrhoids were small.                           The exam was otherwise without abnormality. Complications:            No immediate complications. Estimated Blood Loss:     Estimated blood loss was minimal. Impression:               - One 3 mm polyp in the sigmoid colon, removed with                            a cold biopsy forceps. Resected and retrieved.                           - One 5 mm polyp in the rectum, removed with a cold                            snare. Resected and retrieved.                           - Internal hemorrhoids.                           - The examination was otherwise normal. Recommendation:           - Patient has a contact number available for                             emergencies. The signs and symptoms of potential                            delayed complications were discussed with the                            patient. Return to normal activities tomorrow.                            Written discharge instructions were provided to the                            patient.                           - Resume previous diet.                           - Continue present medications.                           -  Await pathology results.                           - Repeat colonoscopy is recommended. The                            colonoscopy date will be determined after pathology                            results from today's exam become available for                            review. Jerene Bears, MD 10/22/2016 11:29:23 AM This report has been signed electronically.

## 2016-10-23 ENCOUNTER — Telehealth: Payer: Self-pay | Admitting: *Deleted

## 2016-10-23 ENCOUNTER — Telehealth: Payer: Self-pay

## 2016-10-23 NOTE — Telephone Encounter (Signed)
  Follow up Call-  Call back number 10/22/2016  Post procedure Call Back phone  # [phone]  Permission to leave phone message Yes  Some recent data might be hidden     Patient questions:  Do you have a fever, pain , or abdominal swelling? No. Pain Score  0 *  Have you tolerated food without any problems? Yes.    Have you been able to return to your normal activities? Yes.    Do you have any questions about your discharge instructions: Diet   No. Medications  No. Follow up visit  No.  Do you have questions or concerns about your Care? No.  Actions: * If pain score is 4 or above: No action needed, pain <4.

## 2016-10-23 NOTE — Telephone Encounter (Signed)
  Follow up Call-  Call back number 10/22/2016  Post procedure Call Back phone  # [phone]  Permission to leave phone message Yes  Some recent data might be hidden    Patient was called for follow up after his procedure on 10/22/2016. No answer at the number given for follow up phone call. A message was left on the answering machine.

## 2016-10-23 NOTE — Telephone Encounter (Signed)
  Follow up Call-  Call back number 10/22/2016  Post procedure Call Back phone  # [phone]  Permission to leave phone message Yes  Some recent data might be hidden    [facility]

## 2016-10-29 ENCOUNTER — Encounter: Payer: Self-pay | Admitting: Internal Medicine

## 2016-12-07 ENCOUNTER — Telehealth: Payer: Self-pay | Admitting: Internal Medicine

## 2016-12-07 NOTE — Telephone Encounter (Signed)
PLEASE NOTE: All timestamps contained within this report are represented as [place] Standard Time. CONFIDENTIALTY NOTICE: This fax transmission is intended only for the addressee. It contains information that is legally privileged, confidential or otherwise protected from use or disclosure. If you are not the intended recipient, you are strictly prohibited from reviewing, disclosing, copying using or disseminating any of this information or taking any action in reliance on or regarding this information. If you have received this fax in error, please notify us immediately by telephone so that we can arrange for its return to [place]. Phone: [phone], Toll-Free: [phone], Fax: [phone] Page: 1 of 1 Call Id: [id number] [facility] Patient Name: BUBBER GUEVARRA DOB: 1961/11/12 Initial Comment Caller states he has tingling and numbness in left side of his neck, shoulder and hand Nurse Assessment Nurse: Dimas Chyle, RN, Dellis Filbert Date/Time (Eastern Time): 12/07/2016 1:23:51 PM Confirm and document reason for call. If symptomatic, describe symptoms. ---Caller states he has tingling and numbness in left side of his neck, shoulder and hand. Started 3 days ago. Does the patient have any new or worsening symptoms? ---Yes Will a triage be completed? ---Yes Related visit to physician within the last 2 weeks? ---No Does the PT have any chronic conditions? (i.e. diabetes, asthma, etc.) ---Yes List chronic conditions. ---HTN Is this a behavioral health or substance abuse call? ---No Guidelines Guideline Title Affirmed Question Affirmed Notes Neurologic Deficit [1] Tingling in hand (e.g., pins and needles) AND [2] after prolonged laying on arm AND [3] brief (now gone) Final Disposition User See PCP When Office is Open (within 3 days) Dimas Chyle, RN, [employer] has appointment on 12/10/16 with PCP. Disagree/Comply:  Comply

## 2016-12-10 ENCOUNTER — Ambulatory Visit (INDEPENDENT_AMBULATORY_CARE_PROVIDER_SITE_OTHER): Payer: BLUE CROSS/BLUE SHIELD | Admitting: Internal Medicine

## 2016-12-10 ENCOUNTER — Other Ambulatory Visit: Payer: Self-pay | Admitting: Internal Medicine

## 2016-12-10 ENCOUNTER — Other Ambulatory Visit (INDEPENDENT_AMBULATORY_CARE_PROVIDER_SITE_OTHER): Payer: BLUE CROSS/BLUE SHIELD

## 2016-12-10 ENCOUNTER — Encounter: Payer: Self-pay | Admitting: Internal Medicine

## 2016-12-10 ENCOUNTER — Ambulatory Visit: Payer: BLUE CROSS/BLUE SHIELD | Admitting: Nurse Practitioner

## 2016-12-10 VITALS — BP 130/80 | HR 81 | Temp 98.1°F | Resp 14 | Ht 74.0 in | Wt 270.3 lb

## 2016-12-10 DIAGNOSIS — R0789 Other chest pain: Secondary | ICD-10-CM

## 2016-12-10 DIAGNOSIS — M545 Low back pain, unspecified: Secondary | ICD-10-CM | POA: Insufficient documentation

## 2016-12-10 DIAGNOSIS — I10 Essential (primary) hypertension: Secondary | ICD-10-CM

## 2016-12-10 LAB — CBC
HCT: 40.4 % (ref 39.0–52.0)
HEMOGLOBIN: 13.6 g/dL (ref 13.0–17.0)
MCHC: 33.7 g/dL (ref 30.0–36.0)
MCV: 83.9 fl (ref 78.0–100.0)
PLATELETS: 269 10*3/uL (ref 150.0–400.0)
RBC: 4.81 Mil/uL (ref 4.22–5.81)
RDW: 15 % (ref 11.5–15.5)
WBC: 8.2 10*3/uL (ref 4.0–10.5)

## 2016-12-10 LAB — COMPREHENSIVE METABOLIC PANEL
ALBUMIN: 4.1 g/dL (ref 3.5–5.2)
ALK PHOS: 63 U/L (ref 39–117)
ALT: 17 U/L (ref 0–53)
AST: 16 U/L (ref 0–37)
BUN: 21 mg/dL (ref 6–23)
CALCIUM: 9 mg/dL (ref 8.4–10.5)
CO2: 26 mEq/L (ref 19–32)
Chloride: 106 mEq/L (ref 96–112)
Creatinine, Ser: 0.81 mg/dL (ref 0.40–1.50)
GFR: 126.2 mL/min (ref >60.00)
GLUCOSE: 94 mg/dL (ref 70–99)
POTASSIUM: 3.8 meq/L (ref 3.5–5.1)
Sodium: 140 mEq/L (ref 135–145)
TOTAL PROTEIN: 6.9 g/dL (ref 6.0–8.3)
Total Bilirubin: 0.2 mg/dL (ref 0.2–1.2)

## 2016-12-10 LAB — TROPONIN I: TNIDX: 0 ug/L (ref 0.00–0.06)

## 2016-12-10 LAB — VITAMIN B12: Vitamin B-12: 338 pg/mL (ref 211–911)

## 2016-12-10 LAB — TSH: TSH: 1.13 u[IU]/mL (ref 0.35–4.50)

## 2016-12-10 MED ORDER — PREDNISONE 20 MG PO TABS: 40.0000 mg | ORAL_TABLET | Freq: Every day | ORAL | 0 refills | Status: DC

## 2016-12-10 MED ORDER — CYCLOBENZAPRINE HCL 5 MG PO TABS: 5.0000 mg | ORAL_TABLET | Freq: Three times a day (TID) | ORAL | 1 refills | Status: DC | PRN

## 2016-12-10 NOTE — Assessment & Plan Note (Addendum)
He does have several risk factors. EKG without changes. Checking troponin level today. BP at goal. Smoker and advised to quit. Suspect more nerve etiology with the symptoms in the hands and arm.

## 2016-12-10 NOTE — Progress Notes (Signed)
Pre visit review using our clinic review tool, if applicable. No additional management support is needed unless otherwise documented below in the visit note. 

## 2016-12-10 NOTE — Assessment & Plan Note (Addendum)
Rx for flexeril and he can continue to use the ibuprofen as well at home. No indication for imaging today. No numbness or red flag symptoms. Also rx for prednisone burst to help with pain.

## 2016-12-10 NOTE — Progress Notes (Signed)
   Subjective:    Patient ID: Devin [name], male    DOB: 10-Mar-1961, 55 y.o.   MRN: [id number]  HPI The patient is a 55 YO man coming in for numbness in the left shoulder and arm for about 1 week now. It is also in the left chest and neck. Started about 1 week ago. It does come and go. Not always with exertion. Denies specific chest pain with this. Some numbness in the left arm and shoulder. Is a smoker and has hypertension. Has not tried anything for it as he did not know what to try. Denies diaphoresis with the symptoms. Currently having some numbness in the area. No injury or overuse of the area. No pain in the shoulder or arm. No fevers or chills. Also gets numbness in his hand when waking up which clears in 5-10 minutes.   Another issue he is having is recurrent low back pain. Depending on activity he does have problems. Previously taking voltaren and did not help much. He would like to try something else for this. No numbness in his legs. Some pain across the low back. Caused him to miss work for 2 days while he was recovering. Still having some pain but mildly better. He has been taking ibuprofen 800 mg without much relief.   Review of Systems  Constitutional: Positive for activity change. Negative for appetite change, chills, fatigue, fever and unexpected weight change.  Respiratory: Negative for cough, chest tightness, shortness of breath and wheezing.   Cardiovascular: Negative for chest pain, palpitations and leg swelling.       Numbness and tingling in the chest  Gastrointestinal: Negative for abdominal distention, abdominal pain, constipation, diarrhea and nausea.  Musculoskeletal: Positive for arthralgias, back pain and neck pain. Negative for gait problem, joint swelling, myalgias and neck stiffness.  Skin: Negative.   Neurological: Positive for numbness. Negative for dizziness, seizures, syncope, weakness and headaches.  Psychiatric/Behavioral: Negative.       Objective:   Physical Exam  Constitutional: He is oriented to person, place, and time. He appears well-developed and well-nourished.  HENT:  Head: Normocephalic and atraumatic.  Mouth/Throat: Oropharynx is clear and moist.  Eyes: EOM are normal.  Neck: Normal range of motion. Neck supple. No JVD present.  Cardiovascular: Normal rate and regular rhythm.   No murmur heard. Pulmonary/Chest: Effort normal and breath sounds normal. No respiratory distress. He has no wheezes. He has no rales.  Abdominal: Soft. He exhibits no distension. There is no tenderness. There is no rebound.  Musculoskeletal: He exhibits tenderness.  Lymphadenopathy:    He has no cervical adenopathy.  Neurological: He is alert and oriented to person, place, and time. Coordination normal.  Some change in sensation in the left shoulder, going into the left lower neck and left chest wall, not sore to touch. Strength normal in the left hand and arm.   Skin: Skin is warm and dry. He is not diaphoretic.   Vitals:   12/10/16 1112  BP: 130/80  Pulse: 81  Resp: 14  Temp: 98.1 F (36.7 C)  TempSrc: Oral  SpO2: 97%  Weight: 270 lb 4.8 oz (122.6 kg)  Height: 6\' 2"  (1.88 m)   EKG: Rate 84, axis fine, intervals normal, sinus, no st or t wave changes. No change when compared to prior 03/2015 (no longer in sinus tachy).    Assessment & Plan:

## 2016-12-10 NOTE — Patient Instructions (Signed)
We will be checking some labs today to check the heart.   We will try a course of prednisone which may help the shoulder numbness and the lower back pain. Take 2 pills daily for the next 5 days.   We have also sent in a medicine for the low back called flexeril for the low back to help with pain.

## 2016-12-10 NOTE — Assessment & Plan Note (Signed)
BP at goal on amlodipine and lisinopril. Checking CMP and adjust as needed.

## 2017-01-17 ENCOUNTER — Ambulatory Visit: Payer: BLUE CROSS/BLUE SHIELD | Admitting: Internal Medicine

## 2017-06-12 ENCOUNTER — Other Ambulatory Visit: Payer: Self-pay | Admitting: Internal Medicine

## 2017-06-15 ENCOUNTER — Encounter (HOSPITAL_COMMUNITY): Payer: Self-pay | Admitting: Emergency Medicine

## 2017-06-15 ENCOUNTER — Emergency Department (HOSPITAL_COMMUNITY)
Admission: EM | Admit: 2017-06-15 | Discharge: 2017-06-16 | Disposition: A | Discharge status: Home / Self Care | Payer: BLUE CROSS/BLUE SHIELD | Attending: Emergency Medicine | Admitting: Emergency Medicine

## 2017-06-15 DIAGNOSIS — F17210 Nicotine dependence, cigarettes, uncomplicated: Secondary | ICD-10-CM | POA: Insufficient documentation

## 2017-06-15 DIAGNOSIS — I10 Essential (primary) hypertension: Secondary | ICD-10-CM | POA: Insufficient documentation

## 2017-06-15 DIAGNOSIS — M5441 Lumbago with sciatica, right side: Secondary | ICD-10-CM | POA: Insufficient documentation

## 2017-06-15 DIAGNOSIS — Z79899 Other long term (current) drug therapy: Secondary | ICD-10-CM | POA: Insufficient documentation

## 2017-06-15 LAB — URINALYSIS, ROUTINE W REFLEX MICROSCOPIC
BILIRUBIN URINE: NEGATIVE
Glucose, UA: NEGATIVE mg/dL
Hgb urine dipstick: NEGATIVE
KETONES UR: NEGATIVE mg/dL
Leukocytes, UA: NEGATIVE
NITRITE: NEGATIVE
Protein, ur: NEGATIVE mg/dL
Specific Gravity, Urine: 1.014 (ref 1.005–1.030)
pH: 5 (ref 5.0–8.0)

## 2017-06-15 NOTE — ED Triage Notes (Signed)
Pt c/o R flank pain radiating down R leg. Pt denies injury, denies urinary s/s.

## 2017-06-15 NOTE — ED Provider Notes (Signed)
[facility] DEPT Provider Note   CSN: [id number] Arrival date & time: 06/15/17  2115  By signing my name below, I, Ny'Kea Lewis, attest that this documentation has been prepared under the direction and in the presence of Sherwood Gambler, MD. Electronically Signed: Lise Auer, ED Scribe. 06/16/17. 12:03 AM.  History   Chief Complaint Chief Complaint  Patient presents with  . Back Pain   The history is provided by the patient. No language interpreter was used.    HPI Comments: Devin [name] is a 56 y.o. male with a history of hypertension who presents to the Emergency Department complaining of gradually worsening right lower back pain that radiates laterally into the right knee, that began three days ago. Pt reports when the pain initially started it was tolerable, but as the days progressed the pain worsened. He notes pain with positional movements. Reports alleviation of pain when lying left lateral recumbent. Has tried extra strength Tylenol with no relief. Denies hx of back pain or recent injuries. The pain goes down the lateral aspect of his knee. Denies numbness, weakness, fever, unexpected weight loss, abdominal pain, bladder or bowel incontinence. No dysuria or hematuria.  Past Medical History:  Diagnosis Date  . Gout   . Hypertension    Patient Active Problem List   Diagnosis Date Noted  . Atypical chest pain 12/10/2016  . Low back pain 12/10/2016  . ED (erectile dysfunction) 12/27/2015  . Essential hypertension 12/27/2015  . Hx of gout 12/27/2015  . Obesity 12/27/2015  . Tobacco abuse 12/27/2015    Past Surgical History:  Procedure Laterality Date  . KNEE SURGERY      Home Medications    Prior to Admission medications   Medication Sig Start Date End Date Taking? Authorizing Provider  amLODipine (NORVASC) 10 MG tablet TAKE 1 TABLET EVERY DAY 10/04/16   Hoyt Koch, MD  calcipotriene (DOVONOX) 0.005 % cream  11/26/16   [provider]    celecoxib (CELEBREX) 200 MG capsule Take 1 capsule (200 mg total) by mouth 2 (two) times daily. 10/05/16   Hyatt, Max T, DPM  [facility] CLOTRIMAZOLE 1 % cream APPLY TO AFFECTED AREA TWICE A DAY 06/07/16   [provider]  cyclobenzaprine (FLEXERIL) 5 MG tablet Take 1 tablet (5 mg total) by mouth 3 (three) times daily as needed for muscle spasms. 12/10/16   Hoyt Koch, MD  diclofenac (VOLTAREN) 75 MG EC tablet TAKE 1 TABLET BY MOUTH TWICE A DAY 12/11/16   Hoyt Koch, MD  HYDROcodone-acetaminophen (NORCO) 5-325 MG tablet Take 1-2 tablets by mouth every 4 (four) hours as needed for severe pain. 06/16/17   Sherwood Gambler, MD  ibuprofen (ADVIL,MOTRIN) 600 MG tablet Take 1 tablet (600 mg total) by mouth every 8 (eight) hours as needed. 06/16/17   Sherwood Gambler, MD  lisinopril (PRINIVIL,ZESTRIL) 20 MG tablet Take 1 tablet (20 mg total) by mouth daily. 04/20/16   Hoyt Koch, MD  lisinopril (PRINIVIL,ZESTRIL) 20 MG tablet TAKE ONE TABLET BY MOUTH DAILY 06/13/17   Hoyt Koch, MD  methocarbamol (ROBAXIN) 500 MG tablet Take 1 tablet (500 mg total) by mouth every 8 (eight) hours as needed for muscle spasms. 06/16/17   Sherwood Gambler, MD  predniSONE (DELTASONE) 20 MG tablet Take 2 tablets (40 mg total) by mouth daily with breakfast. 12/10/16   Hoyt Koch, MD  sildenafil (VIAGRA) 100 MG tablet Take 0.5-1 tablets (50-100 mg total) by mouth daily as needed for erectile dysfunction. 04/20/16  Hoyt Koch, MD  triamcinolone cream (KENALOG) 0.1 % APPLY TO AFFECTED AREA TWICE A DAY 10/29/16   [provider]    Family History Family History  Problem Relation Age of Onset  . Cancer Mother   . Stroke Father   . Colon cancer Neg Hx     Social History Social History  Substance Use Topics  . Smoking status: Current Every Day Smoker    Packs/day: 0.30    Types: Cigarettes  . Smokeless tobacco: Never Used  . Alcohol use No      Allergies   Patient has no known allergies.   Review of Systems Review of Systems  Constitutional: Negative for fever and unexpected weight change.  Gastrointestinal: Negative for abdominal pain.  Genitourinary: Negative for dysuria.  Musculoskeletal: Positive for back pain.  Neurological: Negative for weakness and numbness.  All other systems reviewed and are negative.   Physical Exam Updated Vital Signs BP (!) 145/91   Pulse 90   Temp 98.7 F (37.1 C) (Oral)   Resp 15   Ht 6\' 2"  (1.88 m)   Wt 113.4 kg (250 lb)   SpO2 94%   BMI 32.10 kg/m   Physical Exam  Constitutional: He is oriented to person, place, and time. He appears well-developed and well-nourished.  HENT:  Head: Normocephalic and atraumatic.  Right Ear: External ear normal.  Left Ear: External ear normal.  Nose: Nose normal.  Eyes: Right eye exhibits no discharge. Left eye exhibits no discharge.  Neck: Neck supple.  Cardiovascular: Normal rate, regular rhythm and normal heart sounds.   Pulmonary/Chest: Effort normal and breath sounds normal.  Abdominal: Soft. There is no tenderness.  Musculoskeletal: He exhibits no edema.       Lumbar back: He exhibits tenderness. He exhibits no bony tenderness.       Back:  Neurological: He is alert and oriented to person, place, and time.  5/5 strength in bilateral lower extremities, normal gross sensation.   Skin: Skin is warm and dry.  Nursing note and vitals reviewed.   ED Treatments / Results  DIAGNOSTIC STUDIES: Oxygen Saturation is 96% on RA, adqueate by my interpretation.   COORDINATION OF CARE: 11:46 PM-Discussed next steps with pt. Pt verbalized understanding and is agreeable with the plan.   Labs (all labs ordered are listed, but only abnormal results are displayed) Labs Reviewed  URINALYSIS, ROUTINE W REFLEX MICROSCOPIC - Abnormal; Notable for the following:       Result Value   Color, Urine STRAW (*)    All other components within normal  limits    EKG  EKG Interpretation None       Radiology No results found.  Procedures Procedures (including critical care time) EMERGENCY DEPARTMENT ULTRASOUND  Study: Limited Retroperitoneal Ultrasound of the Abdominal Aorta.  INDICATIONS:Back pain and Age>55 Multiple views of the abdominal aorta were obtained in real-time from the diaphragmatic hiatus to the aortic bifurcation in transverse planes with a multi-frequency probe.  PERFORMED BY: Myself IMAGES ARCHIVED?: Yes LIMITATIONS:  Body habitus and Bowel gas INTERPRETATION:  No abdominal aortic aneurysm    Medications Ordered in ED Medications  ketorolac (TORADOL) injection 60 mg (60 mg Intramuscular Given 06/16/17 0023)     Initial Impression / Assessment and Plan / ED Course  I have reviewed the triage vital signs and the nursing notes.  Pertinent labs & imaging results that were available during my care of the patient were reviewed by me and considered in my medical  decision making (see chart for details).     Patient symptoms are consistent with sciatica. No trauma to suggest needing an emergent image. He is also neurologically intact with no warning signs such as weakness, numbness, or bowel or bladder incontinence. He is driving himself and is unable to get a ride otherwise, does not given narcotics for his significant pain. Will give IM Toradol and discharged with NSAIDs, muscle relaxers, and short course of narcotics given degree of pain. Discussed warning signs such as fevers, neurologic complaints, etc. Follow-up with PCP and if not improving with conservative measures may need MRI.  Final Clinical Impressions(s) / ED Diagnoses   Final diagnoses:  Acute right-sided low back pain with right-sided sciatica    New Prescriptions Discharge Medication List as of 06/16/2017 12:22 AM    START taking these medications   Details  HYDROcodone-acetaminophen (NORCO) 5-325 MG tablet Take 1-2 tablets by mouth every 4  (four) hours as needed for severe pain., Starting Sun 06/16/2017, Print    ibuprofen (ADVIL,MOTRIN) 600 MG tablet Take 1 tablet (600 mg total) by mouth every 8 (eight) hours as needed., Starting Sun 06/16/2017, Print    methocarbamol (ROBAXIN) 500 MG tablet Take 1 tablet (500 mg total) by mouth every 8 (eight) hours as needed for muscle spasms., Starting Sun 06/16/2017, Print       I personally performed the services described in this documentation, which was scribed in my presence. The recorded information has been reviewed and is accurate.     Sherwood Gambler, MD 06/16/17 0130

## 2017-06-16 MED ORDER — METHOCARBAMOL 500 MG PO TABS: 500.0000 mg | ORAL_TABLET | Freq: Three times a day (TID) | ORAL | 0 refills | Status: DC | PRN

## 2017-06-16 MED ORDER — IBUPROFEN 600 MG PO TABS: 600.0000 mg | ORAL_TABLET | Freq: Three times a day (TID) | ORAL | 0 refills | Status: DC | PRN

## 2017-06-16 MED ORDER — KETOROLAC TROMETHAMINE 60 MG/2ML IM SOLN
60.0000 mg | Freq: Once | INTRAMUSCULAR | Status: AC
  Administered 2017-06-16: 60 mg via INTRAMUSCULAR
  Filled 2017-06-16: qty 2

## 2017-06-16 MED ORDER — HYDROCODONE-ACETAMINOPHEN 5-325 MG PO TABS: 1.0000 | ORAL_TABLET | ORAL | 0 refills | Status: DC | PRN

## 2017-06-16 NOTE — ED Notes (Signed)
Pt understood dc material. NAD Noted. Scripts given at dc. 

## 2017-07-12 ENCOUNTER — Emergency Department (HOSPITAL_COMMUNITY)
Admission: EM | Admit: 2017-07-12 | Discharge: 2017-07-12 | Disposition: A | Discharge status: Home / Self Care | Payer: Self-pay | Attending: Emergency Medicine | Admitting: Emergency Medicine

## 2017-07-12 ENCOUNTER — Encounter (HOSPITAL_COMMUNITY): Payer: Self-pay | Admitting: Emergency Medicine

## 2017-07-12 DIAGNOSIS — F17210 Nicotine dependence, cigarettes, uncomplicated: Secondary | ICD-10-CM | POA: Insufficient documentation

## 2017-07-12 DIAGNOSIS — Z791 Long term (current) use of non-steroidal anti-inflammatories (NSAID): Secondary | ICD-10-CM | POA: Insufficient documentation

## 2017-07-12 DIAGNOSIS — M79604 Pain in right leg: Secondary | ICD-10-CM | POA: Insufficient documentation

## 2017-07-12 DIAGNOSIS — M5431 Sciatica, right side: Secondary | ICD-10-CM | POA: Insufficient documentation

## 2017-07-12 DIAGNOSIS — Z79899 Other long term (current) drug therapy: Secondary | ICD-10-CM | POA: Insufficient documentation

## 2017-07-12 DIAGNOSIS — I10 Essential (primary) hypertension: Secondary | ICD-10-CM | POA: Insufficient documentation

## 2017-07-12 MED ORDER — PREDNISONE 20 MG PO TABS
40.0000 mg | ORAL_TABLET | Freq: Once | ORAL | Status: AC
  Administered 2017-07-12: 40 mg via ORAL
  Filled 2017-07-12: qty 2

## 2017-07-12 MED ORDER — HYDROMORPHONE HCL 1 MG/ML IJ SOLN
2.0000 mg | Freq: Once | INTRAMUSCULAR | Status: AC
  Administered 2017-07-12: 2 mg via INTRAMUSCULAR
  Filled 2017-07-12: qty 2

## 2017-07-12 MED ORDER — OXYCODONE-ACETAMINOPHEN 5-325 MG PO TABS: 1.0000 | ORAL_TABLET | Freq: Four times a day (QID) | ORAL | 0 refills | Status: DC | PRN

## 2017-07-12 MED ORDER — PREDNISONE 10 MG PO TABS: 20.0000 mg | ORAL_TABLET | Freq: Two times a day (BID) | ORAL | 0 refills | Status: DC

## 2017-07-12 NOTE — ED Triage Notes (Signed)
Pt reports R lower back pain with radiation to R leg, pt seen 6/23 for same and dx with sciatica.

## 2017-07-12 NOTE — ED Notes (Signed)
ED Provider at bedside. 

## 2017-07-12 NOTE — ED Notes (Addendum)
Pt departed in NAD, refused use of wheelchair. No signature for d/c obtained d/t signature pad malfunction.

## 2017-07-12 NOTE — Discharge Instructions (Signed)
Prednisone as prescribed.  Percocet as prescribed as needed for pain.  Follow-up with your primary Dr. if not improving through the weekend to discuss referral to physical therapy or possibly imaging studies.

## 2017-07-12 NOTE — ED Provider Notes (Signed)
[facility] DEPT Provider Note   CSN: [id number] Arrival date & time: 07/12/17  2500  By signing my name below, I, [name], attest that this documentation has been prepared under the direction and in the presence of Veryl Speak, MD. Electronically Signed: [name], Scribe. 07/12/17. 2:48 AM. History   Chief Complaint Chief Complaint  Patient presents with  . Back Pain  . Leg Pain   The history is provided by the patient. No language interpreter was used.  Back Pain   This is a recurrent problem. The problem occurs constantly. The pain is associated with no known injury. The pain radiates to the right thigh. The pain is at a severity of 10/10. The pain is severe. The symptoms are aggravated by certain positions, bending and twisting. The pain is the same all the time. Pertinent negatives include no bowel incontinence.    HPI Comments: Devin [name] is a 56 y.o. male who presents to the Emergency Department complaining of constant, gradually worsening, right-sided lower back pain that radiates down his legs. He has difficulty walking due to the pain. He states his legs are occasionally tingly. Pt was seen here for this last month and dx with sciatica. He denies bowel/bladder problems or incontinence. He was prescribed pain medications that helped temporarily with moderate relief.  Past Medical History:  Diagnosis Date  . Gout   . Hypertension     Patient Active Problem List   Diagnosis Date Noted  . Atypical chest pain 12/10/2016  . Low back pain 12/10/2016  . ED (erectile dysfunction) 12/27/2015  . Essential hypertension 12/27/2015  . Hx of gout 12/27/2015  . Obesity 12/27/2015  . Tobacco abuse 12/27/2015    Past Surgical History:  Procedure Laterality Date  . KNEE SURGERY         Home Medications    Prior to Admission medications   Medication Sig Start Date End Date Taking? Authorizing Provider  amLODipine (NORVASC) 10 MG tablet TAKE 1 TABLET EVERY DAY  10/04/16   Hoyt Koch, MD  calcipotriene (DOVONOX) 0.005 % cream  11/26/16   [provider]  celecoxib (CELEBREX) 200 MG capsule Take 1 capsule (200 mg total) by mouth 2 (two) times daily. 10/05/16   Hyatt, Max T, DPM  [facility] CLOTRIMAZOLE 1 % cream APPLY TO AFFECTED AREA TWICE A DAY 06/07/16   [provider]  cyclobenzaprine (FLEXERIL) 5 MG tablet Take 1 tablet (5 mg total) by mouth 3 (three) times daily as needed for muscle spasms. 12/10/16   Hoyt Koch, MD  diclofenac (VOLTAREN) 75 MG EC tablet TAKE 1 TABLET BY MOUTH TWICE A DAY 12/11/16   Hoyt Koch, MD  HYDROcodone-acetaminophen (NORCO) 5-325 MG tablet Take 1-2 tablets by mouth every 4 (four) hours as needed for severe pain. 06/16/17   Sherwood Gambler, MD  ibuprofen (ADVIL,MOTRIN) 600 MG tablet Take 1 tablet (600 mg total) by mouth every 8 (eight) hours as needed. 06/16/17   Sherwood Gambler, MD  lisinopril (PRINIVIL,ZESTRIL) 20 MG tablet Take 1 tablet (20 mg total) by mouth daily. 04/20/16   Hoyt Koch, MD  lisinopril (PRINIVIL,ZESTRIL) 20 MG tablet TAKE ONE TABLET BY MOUTH DAILY 06/13/17   Hoyt Koch, MD  methocarbamol (ROBAXIN) 500 MG tablet Take 1 tablet (500 mg total) by mouth every 8 (eight) hours as needed for muscle spasms. 06/16/17   Sherwood Gambler, MD  predniSONE (DELTASONE) 20 MG tablet Take 2 tablets (40 mg total) by mouth daily with breakfast. 12/10/16  Hoyt Koch, MD  sildenafil (VIAGRA) 100 MG tablet Take 0.5-1 tablets (50-100 mg total) by mouth daily as needed for erectile dysfunction. 04/20/16   Hoyt Koch, MD  triamcinolone cream (KENALOG) 0.1 % APPLY TO AFFECTED AREA TWICE A DAY 10/29/16   [provider]    Family History Family History  Problem Relation Age of Onset  . Cancer Mother   . Stroke Father   . Colon cancer Neg Hx     Social History Social History  Substance Use Topics  . Smoking status: Current Every Day Smoker     Packs/day: 0.30    Types: Cigarettes  . Smokeless tobacco: Never Used  . Alcohol use No     Allergies   Patient has no known allergies.   Review of Systems Review of Systems  Gastrointestinal: Negative for bowel incontinence.  Musculoskeletal: Positive for back pain.  All other systems reviewed and are negative.    Physical Exam Updated Vital Signs BP (!) 145/104 (BP Location: Left Arm)   Pulse 96   Temp (!) 97.5 F (36.4 C) (Oral)   Resp 18   Ht 6\' 2"  (1.88 m)   Wt 250 lb (113.4 kg)   SpO2 98%   BMI 32.10 kg/m   Physical Exam  Constitutional: He is oriented to person, place, and time. He appears well-developed and well-nourished.  HENT:  Head: Normocephalic and atraumatic.  Eyes: EOM are normal.  Neck: Normal range of motion.  Cardiovascular: Normal rate, regular rhythm, normal heart sounds and intact distal pulses.   Pulmonary/Chest: Effort normal and breath sounds normal. No respiratory distress.  Abdominal: Soft. He exhibits no distension. There is no tenderness.  Musculoskeletal: Normal range of motion.  TTP over the right lower lumbar soft tissues and buttock.  Neurological: He is alert and oriented to person, place, and time.  DTR's 1+ and symmetrical in both lower extremities. Strength is 5/5 in both lower extremities  Skin: Skin is warm and dry.  Psychiatric: He has a normal mood and affect. Judgment normal.  Nursing note and vitals reviewed.    ED Treatments / Results  DIAGNOSTIC STUDIES: Oxygen Saturation is 98% on RA, normal by my interpretation.    COORDINATION OF CARE: 2:48 AM Discussed treatment plan with pt at bedside and pt agreed to plan.  Labs (all labs ordered are listed, but only abnormal results are displayed) Labs Reviewed - No data to display  EKG  EKG Interpretation None       Radiology No results found.  Procedures Procedures (including critical care time)  Medications Ordered in ED Medications - No data to  display   Initial Impression / Assessment and Plan / ED Course  I have reviewed the triage vital signs and the nursing notes.  Pertinent labs & imaging results that were available during my care of the patient were reviewed by me and considered in my medical decision making (see chart for details).  Patient presents here with complaints of severe pain in his right lower back and buttock that is consistent with sciatica. He was treated for similar symptoms 1 month ago. He improved, however his pain has since returned. He has no deficits on exam or in his history that would suggest an emergent etiology. He is feeling better after medications in the ER. He will be discharged with prednisone, pain medication, and is to follow-up with his primary Dr. if not improving through the weekend.  Final Clinical Impressions(s) / ED Diagnoses  Final diagnoses:  None    New Prescriptions New Prescriptions   No medications on file  I personally performed the services described in this documentation, which was scribed in my presence. The recorded information has been reviewed and is accurate.        Veryl Speak, MD 07/12/17 [phone]

## 2017-07-15 ENCOUNTER — Ambulatory Visit (INDEPENDENT_AMBULATORY_CARE_PROVIDER_SITE_OTHER)
Admission: RE | Admit: 2017-07-15 | Discharge: 2017-07-15 | Disposition: A | Discharge status: Home / Self Care | Payer: PRIVATE HEALTH INSURANCE | Source: Ambulatory Visit | Attending: Nurse Practitioner | Admitting: Nurse Practitioner

## 2017-07-15 ENCOUNTER — Encounter: Payer: Self-pay | Admitting: Nurse Practitioner

## 2017-07-15 ENCOUNTER — Ambulatory Visit (INDEPENDENT_AMBULATORY_CARE_PROVIDER_SITE_OTHER): Payer: PRIVATE HEALTH INSURANCE | Admitting: Nurse Practitioner

## 2017-07-15 VITALS — BP 160/102 | HR 90 | Temp 98.5°F | Ht 74.0 in | Wt 264.0 lb

## 2017-07-15 DIAGNOSIS — G8929 Other chronic pain: Secondary | ICD-10-CM

## 2017-07-15 DIAGNOSIS — M5441 Lumbago with sciatica, right side: Secondary | ICD-10-CM

## 2017-07-15 DIAGNOSIS — I10 Essential (primary) hypertension: Secondary | ICD-10-CM

## 2017-07-15 MED ORDER — AMLODIPINE BESYLATE 10 MG PO TABS: 10.0000 mg | ORAL_TABLET | Freq: Every day | ORAL | 1 refills | Status: DC

## 2017-07-15 MED ORDER — LISINOPRIL 20 MG PO TABS: 20.0000 mg | ORAL_TABLET | Freq: Every day | ORAL | 1 refills | Status: DC

## 2017-07-15 MED ORDER — GABAPENTIN 300 MG PO CAPS: 300.0000 mg | ORAL_CAPSULE | Freq: Two times a day (BID) | ORAL | 3 refills | Status: DC

## 2017-07-15 NOTE — Progress Notes (Signed)
Subjective:  Patient ID: Devin [name], male    DOB: May 29, 1961  Age: 56 y.o. MRN: [id number]  CC: Leg Pain (and back X2 weeks, went to ER states prednisone and oxycodone are not helping) and Medication Refill (lisinopril, amlodipine )   Back Pain  This is a recurrent problem. The current episode started more than 1 month ago. The problem occurs intermittently. The problem has been waxing and waning since onset. The pain is present in the lumbar spine and gluteal. The quality of the pain is described as aching and shooting. The pain radiates to the right thigh. The pain is at a severity of 10/10. The pain is severe. The symptoms are aggravated by lying down, bending, twisting, sitting and standing. Associated symptoms include leg pain and numbness. Pertinent negatives include no abdominal pain, bladder incontinence, bowel incontinence, chest pain, dysuria, fever, headaches, paresis, paresthesias, pelvic pain, perianal numbness, tingling, weakness or weight loss. 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 He has tried analgesics, bed rest, heat, muscle relaxant, ice, home exercises, NSAIDs and walking (treated with hydrocodone, oxycodone, oral prednisone, celebrex, meloxicam, ibuprofen, diclofenac)) for the symptoms. The treatment provided no relief.   Outpatient Medications Prior to Visit  Medication Sig Dispense Refill  . [facility] CLOTRIMAZOLE 1 % cream APPLY TO AFFECTED AREA TWICE A DAY  1  . oxyCODONE-acetaminophen (PERCOCET) 5-325 MG tablet Take 1-2 tablets by mouth every 6 (six) hours as needed. 15 tablet 0  . predniSONE (DELTASONE) 10 MG tablet Take 2 tablets (20 mg total) by mouth 2 (two) times daily with a meal. 20 tablet 0  . sildenafil (VIAGRA) 100 MG tablet Take 0.5-1 tablets (50-100 mg total) by mouth daily as needed for erectile dysfunction. 15 tablet 11  . triamcinolone cream (KENALOG) 0.1 % APPLY TO AFFECTED  AREA TWICE A DAY  1  . amLODipine (NORVASC) 10 MG tablet TAKE 1 TABLET EVERY DAY 90 tablet 3  . calcipotriene (DOVONOX) 0.005 % cream   0  . celecoxib (CELEBREX) 200 MG capsule Take 1 capsule (200 mg total) by mouth 2 (two) times daily. 90 capsule 1  . cyclobenzaprine (FLEXERIL) 5 MG tablet Take 1 tablet (5 mg total) by mouth 3 (three) times daily as needed for muscle spasms. 90 tablet 1  . diclofenac (VOLTAREN) 75 MG EC tablet TAKE 1 TABLET BY MOUTH TWICE A DAY 60 tablet 1  . ibuprofen (ADVIL,MOTRIN) 600 MG tablet Take 1 tablet (600 mg total) by mouth every 8 (eight) hours as needed. 15 tablet 0  . lisinopril (PRINIVIL,ZESTRIL) 20 MG tablet Take 1 tablet (20 mg total) by mouth daily. 90 tablet 3  . lisinopril (PRINIVIL,ZESTRIL) 20 MG tablet TAKE ONE TABLET BY MOUTH DAILY 90 tablet 1  . methocarbamol (ROBAXIN) 500 MG tablet Take 1 tablet (500 mg total) by mouth every 8 (eight) hours as needed for muscle spasms. 15 tablet 0  . HYDROcodone-acetaminophen (NORCO) 5-325 MG tablet Take 1-2 tablets by mouth every 4 (four) hours as needed for severe pain. (Patient not taking: Reported on 07/15/2017) 15 tablet 0   No facility-administered medications prior to visit.     ROS See HPI  Objective:  BP (!) 160/102 (BP Location: Left Arm, Patient Position: Sitting, Cuff Size: Normal)   Pulse 90   Temp 98.5 F (36.9 C) (Oral)   Ht 6\' 2"  (1.88 m)   Wt 264 lb (119.7 kg)   SpO2 98%  BMI 33.90 kg/m   BP Readings from Last 3 Encounters:  07/15/17 (!) 160/102  07/12/17 (!) 135/95  06/16/17 (!) 145/91    Wt Readings from Last 3 Encounters:  07/15/17 264 lb (119.7 kg)  07/12/17 250 lb (113.4 kg)  06/15/17 250 lb (113.4 kg)    Physical Exam  Constitutional: He is oriented to person, place, and time. No distress.  Cardiovascular: Normal rate.   Pulmonary/Chest: Effort normal.  Abdominal: Soft. He exhibits no distension. There is no tenderness.  Musculoskeletal: He exhibits tenderness. He exhibits  no edema.       Right hip: He exhibits decreased range of motion and tenderness. He exhibits normal strength.       Lumbar back: He exhibits tenderness and pain. He exhibits no bony tenderness and no edema.       Right upper leg: Normal.  Neurological: He is alert and oriented to person, place, and time.  Skin: Skin is warm and dry.  Vitals reviewed.   Lab Results  Component Value Date   WBC 8.2 12/10/2016   HGB 13.6 12/10/2016   HCT 40.4 12/10/2016   PLT 269.0 12/10/2016   GLUCOSE 94 12/10/2016   CHOL 188 12/27/2015   TRIG 151.0 (H) 12/27/2015   HDL 40.50 12/27/2015   LDLCALC 117 (H) 12/27/2015   ALT 17 12/10/2016   AST 16 12/10/2016   NA 140 12/10/2016   K 3.8 12/10/2016   CL 106 12/10/2016   CREATININE 0.81 12/10/2016   BUN 21 12/10/2016   CO2 26 12/10/2016   TSH 1.13 12/10/2016   INR 1.0 09/14/2008   HGBA1C 6.3 12/27/2015    No results found.  Assessment & Plan:   Devin [name] was seen today for leg pain and medication refill.  Diagnoses and all orders for this visit:  Chronic right-sided low back pain with right-sided sciatica -     DG Lumbar Spine Complete; Future -     gabapentin (NEURONTIN) 300 MG capsule; Take 1 capsule (300 mg total) by mouth 2 (two) times daily. -     Ambulatory referral to Sports Medicine  Essential hypertension -     amLODipine (NORVASC) 10 MG tablet; Take 1 tablet (10 mg total) by mouth daily. -     lisinopril (PRINIVIL,ZESTRIL) 20 MG tablet; Take 1 tablet (20 mg total) by mouth daily.   I have discontinued Devin [name] celecoxib, calcipotriene, cyclobenzaprine, diclofenac, HYDROcodone-acetaminophen, methocarbamol, and ibuprofen. I have also changed his amLODipine. Additionally, I am having him start on gabapentin. Lastly, I am having him maintain his sildenafil, [facility] CLOTRIMAZOLE, triamcinolone cream, predniSONE, oxyCODONE-acetaminophen, and lisinopril.  Meds ordered this encounter  Medications  . amLODipine (NORVASC) 10 MG tablet    Sig:  Take 1 tablet (10 mg total) by mouth daily.    Dispense:  90 tablet    Refill:  1    Order Specific Question:   Supervising Provider    Answer:   Cassandria Anger [[id number]]  . lisinopril (PRINIVIL,ZESTRIL) 20 MG tablet    Sig: Take 1 tablet (20 mg total) by mouth daily.    Dispense:  90 tablet    Refill:  1    Order Specific Question:   Supervising Provider    Answer:   Cassandria Anger [[id number]]  . gabapentin (NEURONTIN) 300 MG capsule    Sig: Take 1 capsule (300 mg total) by mouth 2 (two) times daily.    Dispense:  60 capsule    Refill:  3  Order Specific Question:   Supervising Provider    Answer:   Cassandria Anger [[id number]]    Follow-up: Return in about 6 months (around 01/15/2018).  Wilfred Lacy, NP

## 2017-07-15 NOTE — Patient Instructions (Addendum)
X-ray indicates scoliosis. Continue prednisone, muscle relaxant and refer to sport medicime. Added gabapentin.

## 2017-07-15 NOTE — Progress Notes (Signed)
Pre visit review using our clinic review tool, if applicable. No additional management support is needed unless otherwise documented below in the visit note. 

## 2017-07-16 ENCOUNTER — Telehealth: Payer: Self-pay | Admitting: Internal Medicine

## 2017-07-16 NOTE — Telephone Encounter (Signed)
Can we place a referral or does patient need to be seen

## 2017-07-16 NOTE — Telephone Encounter (Signed)
If the patient has been seen in the last year he should still be a patient within that practice and no referral should be needed.

## 2017-07-16 NOTE — Telephone Encounter (Signed)
Patient is wanting to go the doctor we referred him to last year for his face. He states the medication they gave him is not working. I do not see a referral in here for a Dermatologist? Can you please advise or have a referral placed for one. Thank you.

## 2017-07-19 ENCOUNTER — Telehealth: Payer: Self-pay | Admitting: Internal Medicine

## 2017-07-19 DIAGNOSIS — G8929 Other chronic pain: Secondary | ICD-10-CM

## 2017-07-19 DIAGNOSIS — M5441 Lumbago with sciatica, right side: Principal | ICD-10-CM

## 2017-07-19 MED ORDER — GABAPENTIN 300 MG PO CAPS: 300.0000 mg | ORAL_CAPSULE | Freq: Three times a day (TID) | ORAL | 3 refills | Status: DC

## 2017-07-19 MED ORDER — METHOCARBAMOL 500 MG PO TABS: 500.0000 mg | ORAL_TABLET | Freq: Three times a day (TID) | ORAL | 0 refills | Status: DC | PRN

## 2017-07-19 NOTE — Telephone Encounter (Signed)
Pt called regarding this, is waiting on a call back

## 2017-07-19 NOTE — Telephone Encounter (Signed)
Pt called stating that he saw Devin [name] on Monday for back and right leg pain. He said that the pain has not improved and wanted to know if there was anything that could be called in for him or what he should do. Please advise. He can be reached on his cell phone [phone]).

## 2017-07-19 NOTE — Telephone Encounter (Signed)
LVM notifying Pt.

## 2017-07-29 NOTE — Progress Notes (Signed)
[name] [address], [place] [address] Phone: [phone] Subjective:    I'm seeing this patient by the request  of:  Nche NP  CC: Lower back pain  [id number]  Devin [name] is a 56 y.o. male coming in with complaint of *lower back pain. Has been a chronic problem. Going on for nearly 2 months. Seems to be more of an intermittent problem with waxing and waning. States that it seems to start in the back and go to her buttocks area. States that sometimes the pain is described as an aching. Severity can be taken a 10. Seems to be aggravated by laying down bending twisting or any type of motion. Sometimes radiating down the legs and can be associated with some numbness.  X-rays were taken 07/15/2017. These were independently visualized by me. Shows the patient does have some scoliosis of the lumbar spine with diffuse degenerative changes in multiple levels of the lumbar spine.    Past Medical History:  Diagnosis Date  . Gout   . Hypertension    Past Surgical History:  Procedure Laterality Date  . KNEE SURGERY     Social History   Social History  . Marital status: Divorced    Spouse name: N/A  . Number of children: N/A  . Years of education: N/A   Social History Main Topics  . Smoking status: Current Every Day Smoker    Packs/day: 0.30    Types: Cigarettes  . Smokeless tobacco: Never Used  . Alcohol use No  . Drug use: No  . Sexual activity: Not Asked   Other Topics Concern  . None   Social History Narrative  . None   No Known Allergies Family History  Problem Relation Age of Onset  . Cancer Mother   . Stroke Father   . Colon cancer Neg Hx      Past medical history, social, surgical and family history all reviewed in electronic medical record.  No pertanent information unless stated regarding to the chief complaint.   Review of Systems:Review of systems updated and as accurate as of 07/31/17  No headache, visual  changes, nausea, vomiting, diarrhea, constipation, dizziness, abdominal pain, skin rash, fevers, chills, night sweats, weight loss, swollen lymph nodes, body aches, , chest pain, shortness of breath, mood changes. Positive muscle aches, joint swelling, weakness of the lower extremity  Objective  Blood pressure (!) 140/96, pulse 86, height 6\' 2"  (1.88 m), weight 263 lb (119.3 kg). Systems examined below as of 07/31/17   General: No apparent distress alert and oriented x3 mood and affect normal, dressed appropriately.  HEENT: Pupils equal, extraocular movements intact  Respiratory: Patient's speak in full sentences and does not appear short of breath  Cardiovascular: No lower extremity edema, non tender, no erythema  Skin: Warm dry intact with no signs of infection or rash on extremities or on axial skeleton.  Abdomen: Soft nontender  Neuro: Cranial nerves II through XII are intact, neurovascularly intact in all extremities with  and 2+ pulses.  Lymph: No lymphadenopathy of posterior or anterior cervical chain or axillae bilaterally.  Gait Antalgic gait with weakness of the right laying  MSK:  Non tender with full range of motion and good stability and symmetric strength and tone of shoulders, elbows, wrist, hip, knee and ankles bilaterally. Arthritic changes of multiple joints   Back Exam:  Inspection: Degenerative scoliosis noted Motion: Flexion 25 deg with worsening pain, Extension 10 deg, Side Bending  to 15 deg bilaterally,  Rotation to 25 deg bilaterally  SLR laying: Positive at 20 on the right side XSLR laying: Positive with radicular sides down the right leg L5 distribution Palpable tenderness: Severely tender to palpation in the L5-S1 paraspinal musculature. FABER: Unable to do secondary to weakness. Sensory change: Gross sensation intact to all lumbar and sacral dermatomes.  Reflexes: 2+ at both patellar tendons, 1+ at achilles tendons on the right compared to left side which is 2+,  Babinski's downgoing.  Strength at foot  Plantar-flexion: 5/5 Dorsi-flexion: 3/5 on the right side compared to contralateral sign Eversion: 5/5 Inversion: 5/5  Leg strength  Quad: 5/5 Hamstring: 5/5 Hip flexor: 3/5 Hip on the right compared to full strength on the left abductors:  3/5 but symmetric Gait antalgic.   Impression and Recommendations:     This case required medical decision making of moderate complexity.      Note: This dictation was prepared with Dragon dictation along with smaller phrase technology. Any transcriptional errors that result from this process are unintentional.

## 2017-07-31 ENCOUNTER — Ambulatory Visit (INDEPENDENT_AMBULATORY_CARE_PROVIDER_SITE_OTHER): Payer: PRIVATE HEALTH INSURANCE | Admitting: Family Medicine

## 2017-07-31 ENCOUNTER — Encounter: Payer: Self-pay | Admitting: Family Medicine

## 2017-07-31 VITALS — BP 140/96 | HR 86 | Ht 74.0 in | Wt 263.0 lb

## 2017-07-31 DIAGNOSIS — M5416 Radiculopathy, lumbar region: Secondary | ICD-10-CM

## 2017-07-31 MED ORDER — METHYLPREDNISOLONE ACETATE 80 MG/ML IJ SUSP
80.0000 mg | Freq: Once | INTRAMUSCULAR | Status: AC
  Administered 2017-07-31: 80 mg via INTRAMUSCULAR

## 2017-07-31 MED ORDER — KETOROLAC TROMETHAMINE 60 MG/2ML IM SOLN
60.0000 mg | Freq: Once | INTRAMUSCULAR | Status: AC
  Administered 2017-07-31: 60 mg via INTRAMUSCULAR

## 2017-07-31 MED ORDER — TRAMADOL HCL 50 MG PO TABS: 50.0000 mg | ORAL_TABLET | Freq: Two times a day (BID) | ORAL | 0 refills | Status: DC | PRN

## 2017-07-31 NOTE — Assessment & Plan Note (Signed)
Patient on x-ray does have diffuse degenerative changes. On exam today patient does have weakness of the lower extremity that is significant, antalgic gait, nighttime awakenings, decreased deep tendon reflexes. Because of all his fingers this is concerning for a significant chronic nerve impingement that is causing neurologic compromise. Patient continued have long-term damage if not taking care soon. Patient is a candidate for possible epidural and nerve root injections and I do feel that MRI is necessary and urgent. Patient will have this ordered today. We discussed with patient at great length fifths such things as [place] bladder incontinence occur that patient should seek medical attention immediately. Patient is in agreement with the plan. Tramadol given for breakthrough pain at this moment. We'll discuss further treatment options after the MRI.

## 2017-07-31 NOTE — Patient Instructions (Signed)
Good to see you  Devin [name] is your friend. Ice 20 minutes 2 times daily. Usually after activity and before bed. We will get MRI of your back as soon as we can.  2 injections today to help with the pain  Tramadol 2 times daily and take with tylenol  If needed we will order the injection in the back and then I want ot see you again 2 weeks after the injection

## 2017-08-08 ENCOUNTER — Other Ambulatory Visit: Payer: Self-pay | Admitting: Family Medicine

## 2017-08-21 ENCOUNTER — Encounter: Payer: Self-pay | Admitting: Family Medicine

## 2017-09-06 ENCOUNTER — Other Ambulatory Visit: Payer: PRIVATE HEALTH INSURANCE

## 2017-09-19 ENCOUNTER — Ambulatory Visit
Admission: RE | Admit: 2017-09-19 | Discharge: 2017-09-19 | Disposition: A | Discharge status: Home / Self Care | Payer: PRIVATE HEALTH INSURANCE | Source: Ambulatory Visit | Attending: Family Medicine | Admitting: Family Medicine

## 2017-09-19 DIAGNOSIS — M5416 Radiculopathy, lumbar region: Secondary | ICD-10-CM

## 2017-09-30 ENCOUNTER — Telehealth: Payer: Self-pay | Admitting: Family Medicine

## 2017-09-30 DIAGNOSIS — M5416 Radiculopathy, lumbar region: Secondary | ICD-10-CM

## 2017-09-30 NOTE — Telephone Encounter (Signed)
L3-L4 would be perfect

## 2017-09-30 NOTE — Telephone Encounter (Signed)
Can you confirm what level?

## 2017-09-30 NOTE — Telephone Encounter (Signed)
Patient would like Dr. Tamala Julian to schedule epidural

## 2017-10-01 NOTE — Telephone Encounter (Signed)
Pt made aware epidural ordered & sent to [facility]. They will contact pt to schedule appt.

## 2017-10-05 ENCOUNTER — Ambulatory Visit: Payer: PRIVATE HEALTH INSURANCE

## 2017-10-09 ENCOUNTER — Other Ambulatory Visit (INDEPENDENT_AMBULATORY_CARE_PROVIDER_SITE_OTHER): Payer: PRIVATE HEALTH INSURANCE

## 2017-10-09 ENCOUNTER — Ambulatory Visit (INDEPENDENT_AMBULATORY_CARE_PROVIDER_SITE_OTHER): Payer: PRIVATE HEALTH INSURANCE | Admitting: Internal Medicine

## 2017-10-09 ENCOUNTER — Encounter: Payer: Self-pay | Admitting: Internal Medicine

## 2017-10-09 VITALS — BP 128/90 | HR 67 | Temp 98.3°F | Ht 74.0 in | Wt 265.0 lb

## 2017-10-09 DIAGNOSIS — L80 Vitiligo: Secondary | ICD-10-CM | POA: Diagnosis not present

## 2017-10-09 DIAGNOSIS — Z23 Encounter for immunization: Secondary | ICD-10-CM

## 2017-10-09 LAB — TSH: TSH: 2.03 u[IU]/mL (ref 0.35–4.50)

## 2017-10-09 LAB — VITAMIN B12: Vitamin B-12: 425 pg/mL (ref 211–911)

## 2017-10-09 LAB — FERRITIN: FERRITIN: 117.2 ng/mL (ref 22.0–322.0)

## 2017-10-09 LAB — VITAMIN D 25 HYDROXY (VIT D DEFICIENCY, FRACTURES): VITD: 15.98 ng/mL — ABNORMAL LOW (ref 30.00–100.00)

## 2017-10-09 MED ORDER — TACROLIMUS 0.1 % EX OINT: TOPICAL_OINTMENT | Freq: Two times a day (BID) | CUTANEOUS | 0 refills | Status: DC

## 2017-10-09 NOTE — Progress Notes (Signed)
   Subjective:    Patient ID: Devin [name], male    DOB: 14-Jul-1961, 56 y.o.   MRN: [id number]  HPI The patient is a 56 YO man coming in for loss of skin pigment. Going on for about 1 year and has seen several providers about it. Given clotrimazole cream which did not help and then triamcinolone cream which helped for a little while. Now not helping and getting worse. On his face, neck, legs and scalp. He denies SOB or joint pains. No fevers or chills. No one in his family has this. He is disturbed by the change to his skin color.   Review of Systems  Constitutional: Negative.   Respiratory: Negative for cough, chest tightness and shortness of breath.   Cardiovascular: Negative for chest pain, palpitations and leg swelling.  Gastrointestinal: Negative for abdominal distention, abdominal pain, constipation, diarrhea, nausea and vomiting.  Musculoskeletal: Negative.   Skin: Positive for color change and rash.  Neurological: Negative.       Objective:   Physical Exam  Constitutional: He is oriented to person, place, and time. He appears well-developed and well-nourished.  HENT:  Head: Normocephalic and atraumatic.  Loss of pigmentation asymetric on the face, scalp, legs. Some are discoid and others are small circular lesions <3 mm diameter  Eyes: EOM are normal.  Neck: Normal range of motion.  Cardiovascular: Normal rate and regular rhythm.   Pulmonary/Chest: Effort normal and breath sounds normal. No respiratory distress. He has no wheezes. He has no rales.  Abdominal: Soft. Bowel sounds are normal. He exhibits no distension. There is no tenderness. There is no rebound.  Musculoskeletal: He exhibits no edema.  Neurological: He is alert and oriented to person, place, and time. Coordination normal.  Skin: Skin is warm and dry.  Psychiatric: He has a normal mood and affect.   Vitals:   10/09/17 0844  BP: 128/90  Pulse: 67  Temp: 98.3 F (36.8 C)  TempSrc: Oral  SpO2: 98%  Weight:  265 lb (120.2 kg)  Height: 6\' 2"  (1.88 m)      Assessment & Plan:  Flu shot given at visit

## 2017-10-09 NOTE — Patient Instructions (Signed)
We have sent in a new cream to use on the lesions twice a day.   We would like you to return to the dermatologist for possible biopsy of a spot to see what is causing it. We are checking the labs for a cause as well.

## 2017-10-09 NOTE — Addendum Note (Signed)
Addended by: Raford Pitcher R on: 10/09/2017 09:19 AM   Modules accepted: Orders

## 2017-10-09 NOTE — Assessment & Plan Note (Signed)
Assumed diagnosis while testing for other conditions. No biopsy at this time and encouraged to return to dermatology for biopsy to rule out systemic disease. No clinical signs of sarcoid. Testing for lupus and other deficiencies today which could affect. Given location on face would not recommend long term usage of steroids and rx for tacrolimus cream today.

## 2017-10-10 ENCOUNTER — Other Ambulatory Visit: Payer: Self-pay | Admitting: Internal Medicine

## 2017-10-10 LAB — ANA: Anti Nuclear Antibody(ANA): NEGATIVE

## 2017-10-10 MED ORDER — VITAMIN D (ERGOCALCIFEROL) 1.25 MG (50000 UNIT) PO CAPS: 50000.0000 [IU] | ORAL_CAPSULE | ORAL | 0 refills | Status: DC

## 2017-10-11 ENCOUNTER — Telehealth: Payer: Self-pay | Admitting: Internal Medicine

## 2017-10-11 NOTE — Telephone Encounter (Signed)
Pt called stating that the prescription for tacrolimus (PROTOPIC) 0.1 % ointment was over $400. Can anything else be sent in that may be cheaper?

## 2017-10-11 NOTE — Telephone Encounter (Signed)
Pt is going to call his insurance company to find out what his plan may cover. He will call us back and let us know what he finds out.

## 2017-10-12 ENCOUNTER — Ambulatory Visit: Payer: PRIVATE HEALTH INSURANCE

## 2017-10-15 NOTE — Telephone Encounter (Signed)
Is there an alternative that could be sent in. Please advise thanks

## 2017-10-15 NOTE — Telephone Encounter (Signed)
Patient states his insurance would not tell him what they would cover.

## 2017-10-15 NOTE — Telephone Encounter (Signed)
This is the only thing that is different that what he has tried before.

## 2017-10-16 NOTE — Telephone Encounter (Signed)
LVM informing patient of this and to call back to let us know what he wants [place] to do

## 2018-01-13 ENCOUNTER — Ambulatory Visit: Payer: PRIVATE HEALTH INSURANCE | Admitting: Internal Medicine

## 2018-01-13 DIAGNOSIS — Z0289 Encounter for other administrative examinations: Secondary | ICD-10-CM

## 2018-08-25 ENCOUNTER — Other Ambulatory Visit: Payer: Self-pay | Admitting: Nurse Practitioner

## 2018-08-25 DIAGNOSIS — I10 Essential (primary) hypertension: Secondary | ICD-10-CM

## 2018-08-25 NOTE — Telephone Encounter (Signed)
Needs refill

## 2018-08-26 NOTE — Telephone Encounter (Signed)
Needs medication refill. Dr. Nathanial Millman patient

## 2018-09-20 ENCOUNTER — Other Ambulatory Visit: Payer: Self-pay | Admitting: Internal Medicine

## 2018-10-17 ENCOUNTER — Other Ambulatory Visit: Payer: Self-pay | Admitting: Internal Medicine

## 2018-11-10 ENCOUNTER — Other Ambulatory Visit: Payer: PRIVATE HEALTH INSURANCE

## 2018-11-21 ENCOUNTER — Other Ambulatory Visit: Payer: Self-pay | Admitting: Internal Medicine

## 2018-12-06 ENCOUNTER — Other Ambulatory Visit: Payer: Self-pay | Admitting: Internal Medicine

## 2019-02-15 ENCOUNTER — Other Ambulatory Visit: Payer: Self-pay

## 2019-02-15 ENCOUNTER — Emergency Department (HOSPITAL_COMMUNITY)
Admission: EM | Admit: 2019-02-15 | Discharge: 2019-02-15 | Disposition: A | Discharge status: Home / Self Care | Payer: PRIVATE HEALTH INSURANCE | Attending: Emergency Medicine | Admitting: Emergency Medicine

## 2019-02-15 ENCOUNTER — Encounter (HOSPITAL_COMMUNITY): Payer: Self-pay | Admitting: *Deleted

## 2019-02-15 ENCOUNTER — Emergency Department (HOSPITAL_COMMUNITY): Payer: PRIVATE HEALTH INSURANCE

## 2019-02-15 DIAGNOSIS — Z79899 Other long term (current) drug therapy: Secondary | ICD-10-CM | POA: Insufficient documentation

## 2019-02-15 DIAGNOSIS — I10 Essential (primary) hypertension: Secondary | ICD-10-CM | POA: Insufficient documentation

## 2019-02-15 DIAGNOSIS — R202 Paresthesia of skin: Secondary | ICD-10-CM

## 2019-02-15 DIAGNOSIS — F17210 Nicotine dependence, cigarettes, uncomplicated: Secondary | ICD-10-CM | POA: Insufficient documentation

## 2019-02-15 MED ORDER — PREDNISONE 20 MG PO TABS: ORAL_TABLET | ORAL | 0 refills | Status: DC

## 2019-02-15 MED ORDER — NAPROXEN 500 MG PO TABS: 500.0000 mg | ORAL_TABLET | Freq: Two times a day (BID) | ORAL | 0 refills | Status: DC

## 2019-02-15 NOTE — Discharge Instructions (Addendum)
Follow-up with your family doctor in 1 to 2 weeks for recheck 

## 2019-02-15 NOTE — ED Provider Notes (Signed)
[facility] Provider Note   CSN: [id number] Arrival date & time: 02/15/19  0906    History   Chief Complaint Chief Complaint  Patient presents with  . Numbness    HPI Devin [name] is a 58 y.o. male.     Patient complains of numbness in his right hand.  Patient operates machinery.  The history is provided by the patient. No language interpreter was used.  Hand Pain  This is a new problem. The current episode started more than 1 week ago. The problem occurs constantly. The problem has not changed since onset.Pertinent negatives include no chest pain, no abdominal pain and no headaches. Nothing aggravates the symptoms. Nothing relieves the symptoms. He has tried nothing for the symptoms. The treatment provided no relief.    Past Medical History:  Diagnosis Date  . Gout   . Hypertension     Patient Active Problem List   Diagnosis Date Noted  . Vitiligo 10/09/2017  . Right lumbar radiculopathy 07/31/2017  . Atypical chest pain 12/10/2016  . Low back pain 12/10/2016  . ED (erectile dysfunction) 12/27/2015  . Essential hypertension 12/27/2015  . Hx of gout 12/27/2015  . Obesity 12/27/2015  . Tobacco abuse 12/27/2015    Past Surgical History:  Procedure Laterality Date  . KNEE SURGERY          Home Medications    Prior to Admission medications   Medication Sig Start Date End Date Taking? Authorizing Provider  amLODipine (NORVASC) 10 MG tablet TAKE 1 TABLET BY MOUTH DAILY Patient taking differently: Take 10 mg by mouth daily.  08/26/18  Yes Hoyt Koch, MD  gabapentin (NEURONTIN) 300 MG capsule Take 1 capsule (300 mg total) by mouth 3 (three) times daily. Patient not taking: Reported on 02/15/2019 07/19/17   Nche, Charlene Brooke, NP  lisinopril (PRINIVIL,ZESTRIL) 20 MG tablet Take 1 tablet (20 mg total) by mouth daily. Patient not taking: Reported on 02/15/2019 07/15/17   Nche, Charlene Brooke, NP  lisinopril  (PRINIVIL,ZESTRIL) 20 MG tablet Take 1 tablet (20 mg total) by mouth daily. Annual appt due in Oct must see provider for future refills Patient not taking: Reported on 02/15/2019 09/22/18   Hoyt Koch, MD  methocarbamol (ROBAXIN) 500 MG tablet Take 1 tablet (500 mg total) by mouth every 8 (eight) hours as needed for muscle spasms. Patient not taking: Reported on 02/15/2019 07/19/17   Nche, Charlene Brooke, NP  naproxen (NAPROSYN) 500 MG tablet Take 1 tablet (500 mg total) by mouth 2 (two) times daily. 02/15/19   Milton Ferguson, MD  oxyCODONE-acetaminophen (PERCOCET) 5-325 MG tablet Take 1-2 tablets by mouth every 6 (six) hours as needed. Patient not taking: Reported on 02/15/2019 07/12/17   Veryl Speak, MD  predniSONE (DELTASONE) 20 MG tablet 2 tabs po daily x 3 days 02/15/19   Milton Ferguson, MD  sildenafil (VIAGRA) 100 MG tablet Take 0.5-1 tablets (50-100 mg total) by mouth daily as needed for erectile dysfunction. Patient not taking: Reported on 02/15/2019 04/20/16   Hoyt Koch, MD  tacrolimus (PROTOPIC) 0.1 % ointment Apply topically 2 (two) times daily. Patient not taking: Reported on 02/15/2019 10/09/17   Hoyt Koch, MD  traMADol (ULTRAM) 50 MG tablet Take 1 tablet (50 mg total) by mouth every 12 (twelve) hours as needed. Patient not taking: Reported on 02/15/2019 07/31/17   Lyndal Pulley, DO  Vitamin D, Ergocalciferol, (DRISDOL) 50000 units CAPS capsule Take 1 capsule (50,000 Units total) by  mouth every 7 (seven) days. Patient not taking: Reported on 02/15/2019 10/10/17   Hoyt Koch, MD    Family History Family History  Problem Relation Age of Onset  . Cancer Mother   . Stroke Father   . Colon cancer Neg Hx     Social History Social History   Tobacco Use  . Smoking status: Current Every Day Smoker    Packs/day: 0.30    Types: Cigarettes  . Smokeless tobacco: Never Used  Substance Use Topics  . Alcohol use: No  . Drug use: No      Allergies   Patient has no known allergies.   Review of Systems Review of Systems  Constitutional: Negative for appetite change and fatigue.  HENT: Negative for congestion, ear discharge and sinus pressure.   Eyes: Negative for discharge.  Respiratory: Negative for cough.   Cardiovascular: Negative for chest pain.  Gastrointestinal: Negative for abdominal pain and diarrhea.  Genitourinary: Negative for frequency and hematuria.  Musculoskeletal: Negative for back pain.  Skin: Negative for rash.  Neurological: Negative for seizures and headaches.       Numbness and tingling right hand  Psychiatric/Behavioral: Negative for hallucinations.     Physical Exam Updated Vital Signs BP (!) 133/103 (BP Location: Left Arm)   Pulse (!) 110   Temp 98.4 F (36.9 C) (Oral)   Ht 6\' 2"  (1.88 m)   Wt 113.4 kg   SpO2 99%   BMI 32.10 kg/m   Physical Exam Vitals signs and nursing note reviewed.  Constitutional:      Appearance: He is well-developed.  HENT:     Head: Normocephalic.     Nose: Nose normal.  Eyes:     General: No scleral icterus.    Conjunctiva/sclera: Conjunctivae normal.  Neck:     Musculoskeletal: Neck supple.     Thyroid: No thyromegaly.  Cardiovascular:     Rate and Rhythm: Normal rate and regular rhythm.     Heart sounds: No murmur. No friction rub. No gallop.   Pulmonary:     Breath sounds: No stridor. No wheezing or rales.  Chest:     Chest wall: No tenderness.  Abdominal:     General: There is no distension.     Tenderness: There is no abdominal tenderness. There is no rebound.  Musculoskeletal: Normal range of motion.     Comments: Patient has mild tenderness to right wrist but full range of motion  Lymphadenopathy:     Cervical: No cervical adenopathy.  Skin:    Findings: No erythema or rash.  Neurological:     Mental Status: He is oriented to person, place, and time.     Motor: No abnormal muscle tone.     Coordination: Coordination normal.   Psychiatric:        Behavior: Behavior normal.      ED Treatments / Results  Labs (all labs ordered are listed, but only abnormal results are displayed) Labs Reviewed - No data to display  EKG None  Radiology Dg Cervical Spine Complete  Result Date: 02/15/2019 CLINICAL DATA:  Right hand numbness.  No trauma. EXAM: CERVICAL SPINE - COMPLETE 4+ VIEW COMPARISON:  None. FINDINGS: The lateral view images through the bottom of C6. Prevertebral soft tissues are within normal limits. C5-6 and C6-7 spondylosis with endplate osteophyte formation and loss of intervertebral disc height. Borderline loss of C5 vertebral body height anteriorly, favored to be degenerative or possibly remote posttraumatic. Oblique images demonstrate C5-6 and C6-7  neural foraminal narrowing bilaterally, secondary to uncovertebral joint hypertrophy. No gross abnormality at the cervicothoracic junction. IMPRESSION: C5-6 and C6-7 spondylosis, resulting in bilateral neural foraminal narrowing. Electronically Signed   By: Abigail Miyamoto M.D.   On: 02/15/2019 10:40    Procedures Procedures (including critical care time)  Medications Ordered in ED Medications - No data to display   Initial Impression / Assessment and Plan / ED Course  I have reviewed the triage vital signs and the nursing notes.  Pertinent labs & imaging results that were available during my care of the patient were reviewed by me and considered in my medical decision making (see chart for details).    Patient has some cervical spondylosis.  Suspect this is causing  the paresthesias in his right hand.  Patient will be placed on prednisone and Naprosyn and will follow-up with his PCP     Final Clinical Impressions(s) / ED Diagnoses   Final diagnoses:  Paresthesias    ED Discharge Orders         Ordered    predniSONE (DELTASONE) 20 MG tablet     02/15/19 1106    naproxen (NAPROSYN) 500 MG tablet  2 times daily     02/15/19 1106            Milton Ferguson, MD 02/15/19 1109

## 2019-02-15 NOTE — ED Triage Notes (Signed)
PT reports Numbness to RT hand. Pt unable to report how long. 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

## 2019-03-02 ENCOUNTER — Other Ambulatory Visit: Payer: Self-pay | Admitting: Internal Medicine

## 2019-03-02 DIAGNOSIS — I10 Essential (primary) hypertension: Secondary | ICD-10-CM

## 2019-04-06 ENCOUNTER — Other Ambulatory Visit: Payer: Self-pay | Admitting: Internal Medicine

## 2019-04-06 DIAGNOSIS — I10 Essential (primary) hypertension: Secondary | ICD-10-CM

## 2019-05-12 ENCOUNTER — Other Ambulatory Visit: Payer: Self-pay | Admitting: Internal Medicine

## 2019-05-12 DIAGNOSIS — I10 Essential (primary) hypertension: Secondary | ICD-10-CM

## 2019-05-17 ENCOUNTER — Other Ambulatory Visit: Payer: Self-pay | Admitting: Internal Medicine

## 2019-05-17 DIAGNOSIS — I10 Essential (primary) hypertension: Secondary | ICD-10-CM

## 2019-05-19 ENCOUNTER — Other Ambulatory Visit: Payer: Self-pay | Admitting: Internal Medicine

## 2019-05-19 DIAGNOSIS — I10 Essential (primary) hypertension: Secondary | ICD-10-CM

## 2019-05-25 ENCOUNTER — Ambulatory Visit: Payer: PRIVATE HEALTH INSURANCE | Admitting: Internal Medicine

## 2019-05-25 ENCOUNTER — Other Ambulatory Visit: Payer: Self-pay | Admitting: Internal Medicine

## 2019-05-25 DIAGNOSIS — I10 Essential (primary) hypertension: Secondary | ICD-10-CM

## 2019-05-26 ENCOUNTER — Other Ambulatory Visit (INDEPENDENT_AMBULATORY_CARE_PROVIDER_SITE_OTHER): Payer: PRIVATE HEALTH INSURANCE

## 2019-05-26 ENCOUNTER — Encounter: Payer: Self-pay | Admitting: Internal Medicine

## 2019-05-26 ENCOUNTER — Other Ambulatory Visit: Payer: Self-pay

## 2019-05-26 ENCOUNTER — Ambulatory Visit (INDEPENDENT_AMBULATORY_CARE_PROVIDER_SITE_OTHER): Payer: PRIVATE HEALTH INSURANCE | Admitting: Internal Medicine

## 2019-05-26 VITALS — BP 160/102 | HR 84 | Temp 98.5°F | Ht 74.0 in | Wt 283.0 lb

## 2019-05-26 DIAGNOSIS — M5441 Lumbago with sciatica, right side: Secondary | ICD-10-CM | POA: Diagnosis not present

## 2019-05-26 DIAGNOSIS — M5416 Radiculopathy, lumbar region: Secondary | ICD-10-CM

## 2019-05-26 DIAGNOSIS — Z720 Tobacco use: Secondary | ICD-10-CM | POA: Diagnosis not present

## 2019-05-26 DIAGNOSIS — Z6836 Body mass index (BMI) 36.0-36.9, adult: Secondary | ICD-10-CM

## 2019-05-26 DIAGNOSIS — I10 Essential (primary) hypertension: Secondary | ICD-10-CM

## 2019-05-26 DIAGNOSIS — G8929 Other chronic pain: Secondary | ICD-10-CM

## 2019-05-26 LAB — CBC
HCT: 40.5 % (ref 39.0–52.0)
Hemoglobin: 13.8 g/dL (ref 13.0–17.0)
MCHC: 34.1 g/dL (ref 30.0–36.0)
MCV: 84 fl (ref 78.0–100.0)
Platelets: 249 10*3/uL (ref 150.0–400.0)
RBC: 4.82 Mil/uL (ref 4.22–5.81)
RDW: 15.3 % (ref 11.5–15.5)
WBC: 9.6 10*3/uL (ref 4.0–10.5)

## 2019-05-26 LAB — COMPREHENSIVE METABOLIC PANEL
ALT: 21 U/L (ref 0–53)
AST: 14 U/L (ref 0–37)
Albumin: 3.9 g/dL (ref 3.5–5.2)
Alkaline Phosphatase: 76 U/L (ref 39–117)
BUN: 14 mg/dL (ref 6–23)
CO2: 29 mEq/L (ref 19–32)
Calcium: 9.1 mg/dL (ref 8.4–10.5)
Chloride: 108 mEq/L (ref 96–112)
Creatinine, Ser: 0.79 mg/dL (ref 0.40–1.50)
GFR: 121.17 mL/min (ref >60.00)
Glucose, Bld: 92 mg/dL (ref 70–99)
Potassium: 3.9 mEq/L (ref 3.5–5.1)
Sodium: 142 mEq/L (ref 135–145)
Total Bilirubin: 0.2 mg/dL (ref 0.2–1.2)
Total Protein: 7 g/dL (ref 6.0–8.3)

## 2019-05-26 LAB — HEMOGLOBIN A1C: Hgb A1c MFr Bld: 6.3 % (ref 4.6–6.5)

## 2019-05-26 LAB — LDL CHOLESTEROL, DIRECT: Direct LDL: 111 mg/dL

## 2019-05-26 LAB — LIPID PANEL
Cholesterol: 185 mg/dL (ref 0–200)
HDL: 42.9 mg/dL (ref >39.00)
NonHDL: 141.85
Total CHOL/HDL Ratio: 4
Triglycerides: 248 mg/dL — ABNORMAL HIGH (ref 0.0–149.0)
VLDL: 49.6 mg/dL — ABNORMAL HIGH (ref 0.0–40.0)

## 2019-05-26 MED ORDER — LISINOPRIL 20 MG PO TABS: 20.0000 mg | ORAL_TABLET | Freq: Every day | ORAL | 0 refills | Status: DC

## 2019-05-26 MED ORDER — GABAPENTIN 300 MG PO CAPS: 300.0000 mg | ORAL_CAPSULE | Freq: Three times a day (TID) | ORAL | 3 refills | Status: DC

## 2019-05-26 MED ORDER — AMLODIPINE BESYLATE 10 MG PO TABS: 10.0000 mg | ORAL_TABLET | Freq: Every day | ORAL | 0 refills | Status: DC

## 2019-05-26 NOTE — Assessment & Plan Note (Signed)
Complicated by back pain, hypertension.

## 2019-05-26 NOTE — Patient Instructions (Signed)
We have sent in the medications for blood pressure called lisinopril and amlodipine. Take 1 pill of these daily for both.   We have also sent in the medicine for the pain which you can take up to 3 times per day called gabapentin.   We are checking the labs today and will call back with results.   We need to see you back in 3-4 weeks for a visit and more labs since we are starting you back on medicines.    Hypertension Hypertension, commonly called high blood pressure, is when the force of blood pumping through the arteries is too strong. The arteries are the blood vessels that carry blood from the heart throughout the body. Hypertension forces the heart to work harder to pump blood and may cause arteries to become narrow or stiff. Having untreated or uncontrolled hypertension can cause heart attacks, strokes, kidney disease, and other problems. A blood pressure reading consists of a higher number over a lower number. Ideally, your blood pressure should be below 120/80. The first ("top") number is called the systolic pressure. It is a measure of the pressure in your arteries as your heart beats. The second ("bottom") number is called the diastolic pressure. It is a measure of the pressure in your arteries as the heart relaxes. What are the causes? The cause of this condition is not known. What increases the risk? Some risk factors for high blood pressure are under your control. Others are not. Factors you can change  Smoking.  Having type 2 diabetes mellitus, high cholesterol, or both.  Not getting enough exercise or physical activity.  Being overweight.  Having too much fat, sugar, calories, or salt (sodium) in your diet.  Drinking too much alcohol. Factors that are difficult or impossible to change  Having chronic kidney disease.  Having a family history of high blood pressure.  Age. Risk increases with age.  Race. You may be at higher risk if you are African-American.   Gender. Men are at higher risk than women before age 41. After age 51, women are at higher risk than men.  Having obstructive sleep apnea.  Stress. What are the signs or symptoms? Extremely high blood pressure (hypertensive crisis) may cause:  Headache.  Anxiety.  Shortness of breath.  Nosebleed.  Nausea and vomiting.  Severe chest pain.  Jerky movements you cannot control (seizures). How is this diagnosed? This condition is diagnosed by measuring your blood pressure while you are seated, with your arm resting on a surface. The cuff of the blood pressure monitor will be placed directly against the skin of your upper arm at the level of your heart. It should be measured at least twice using the same arm. Certain conditions can cause a difference in blood pressure between your right and left arms. Certain factors can cause blood pressure readings to be lower or higher than normal (elevated) for a short period of time:  When your blood pressure is higher when you are in a health care provider's office than when you are at home, this is called white coat hypertension. Most people with this condition do not need medicines.  When your blood pressure is higher at home than when you are in a health care provider's office, this is called masked hypertension. Most people with this condition may need medicines to control blood pressure. If you have a high blood pressure reading during one visit or you have normal blood pressure with other risk factors:  You may be  asked to return on a different day to have your blood pressure checked again.  You may be asked to monitor your blood pressure at home for 1 week or longer. If you are diagnosed with hypertension, you may have other blood or imaging tests to help your health care provider understand your overall risk for other conditions. How is this treated? This condition is treated by making healthy lifestyle changes, such as eating healthy  foods, exercising more, and reducing your alcohol intake. Your health care provider may prescribe medicine if lifestyle changes are not enough to get your blood pressure under control, and if:  Your systolic blood pressure is above 130.  Your diastolic blood pressure is above 80. Your personal target blood pressure may vary depending on your medical conditions, your age, and other factors. Follow these instructions at home: Eating and drinking   Eat a diet that is high in fiber and potassium, and low in sodium, added sugar, and fat. An example eating plan is called the DASH (Dietary Approaches to Stop Hypertension) diet. To eat this way: ? Eat plenty of fresh fruits and vegetables. Try to fill half of your plate at each meal with fruits and vegetables. ? Eat whole grains, such as whole wheat pasta, brown rice, or whole grain bread. Fill about one quarter of your plate with whole grains. ? Eat or drink low-fat dairy products, such as skim milk or low-fat yogurt. ? Avoid fatty cuts of meat, processed or cured meats, and poultry with skin. Fill about one quarter of your plate with lean proteins, such as fish, chicken without skin, beans, eggs, and tofu. ? Avoid premade and processed foods. These tend to be higher in sodium, added sugar, and fat.  Reduce your daily sodium intake. Most people with hypertension should eat less than 1,500 mg of sodium a day.  Limit alcohol intake to no more than 1 drink a day for nonpregnant women and 2 drinks a day for men. One drink equals 12 oz of beer, 5 oz of wine, or 1 oz of hard liquor. Lifestyle   Work with your health care provider to maintain a healthy body weight or to lose weight. Ask what an ideal weight is for you.  Get at least 30 minutes of exercise that causes your heart to beat faster (aerobic exercise) most days of the week. Activities may include walking, swimming, or biking.  Include exercise to strengthen your muscles (resistance exercise),  such as pilates or lifting weights, as part of your weekly exercise routine. Try to do these types of exercises for 30 minutes at least 3 days a week.  Do not use any products that contain nicotine or tobacco, such as cigarettes and e-cigarettes. If you need help quitting, ask your health care provider.  Monitor your blood pressure at home as told by your health care provider.  Keep all follow-up visits as told by your health care provider. This is important. Medicines  Take over-the-counter and prescription medicines only as told by your health care provider. Follow directions carefully. Blood pressure medicines must be taken as prescribed.  Do not skip doses of blood pressure medicine. Doing this puts you at risk for problems and can make the medicine less effective.  Ask your health care provider about side effects or reactions to medicines that you should watch for. Contact a health care provider if:  You think you are having a reaction to a medicine you are taking.  You have headaches that keep  coming back (recurring).  You feel dizzy.  You have swelling in your ankles.  You have trouble with your vision. Get help right away if:  You develop a severe headache or confusion.  You have unusual weakness or numbness.  You feel faint.  You have severe pain in your chest or abdomen.  You vomit repeatedly.  You have trouble breathing. Summary  Hypertension is when the force of blood pumping through your arteries is too strong. If this condition is not controlled, it may put you at risk for serious complications.  Your personal target blood pressure may vary depending on your medical conditions, your age, and other factors. For most people, a normal blood pressure is less than 120/80.  Hypertension is treated with lifestyle changes, medicines, or a combination of both. Lifestyle changes include weight loss, eating a healthy, low-sodium diet, exercising more, and limiting  alcohol. This information is not intended to replace advice given to you by your health care provider. Make sure you discuss any questions you have with your health care provider. Document Released: 12/10/2005 Document Revised: 11/07/2016 Document Reviewed: 11/07/2016 Elsevier Interactive Patient Education  2019 [employer].

## 2019-05-26 NOTE — Assessment & Plan Note (Signed)
Will do labs and restart two medications amlodipine and lisinopril. Needs visit in 2-3 weeks for BP readings and labs. Checking CMP today and EKG done due to symptoms and high BP readings.

## 2019-05-26 NOTE — Progress Notes (Signed)
   Subjective:   Patient ID: Devin [name], male    DOB: 04/18/1961, 58 y.o.   MRN: [id number]  HPI The patient is a 58 YO man coming in for follow up of blood pressure (ran out of medication some time ago due to lack of visit, not seen since 2018, does have headaches with this currently and some feelings of chest pressure intermittent, does not have any medicine currently, used to take regularly and is not sure if this was working or not as he did not monitor pressure) and tobacco abuse (smoking some less at this time and pack lasts several days to a week, denies cough or SOB currently) and back pain (used to use gabapentin and has not had this in a long time, denies new injury or fall, denies new weakness or numbness, pain is chronic, able to work through this currently).   Review of Systems  Constitutional: Negative.   HENT: Negative.   Eyes: Negative.   Respiratory: Negative for cough, chest tightness and shortness of breath.   Cardiovascular: Negative for chest pain, palpitations and leg swelling.  Gastrointestinal: Negative for abdominal distention, abdominal pain, constipation, diarrhea, nausea and vomiting.  Musculoskeletal: Negative.   Skin: Negative.   Neurological: Positive for headaches. Negative for tremors and weakness.  Psychiatric/Behavioral: Negative.     Objective:  Physical Exam Constitutional:      Appearance: He is well-developed.  HENT:     Head: Normocephalic and atraumatic.  Neck:     Musculoskeletal: Normal range of motion.  Cardiovascular:     Rate and Rhythm: Normal rate and regular rhythm.  Pulmonary:     Effort: Pulmonary effort is normal. No respiratory distress.     Breath sounds: Normal breath sounds. No wheezing or rales.  Abdominal:     General: Bowel sounds are normal. There is no distension.     Palpations: Abdomen is soft.     Tenderness: There is no abdominal tenderness. There is no rebound.  Skin:    General: Skin is warm and dry.   Neurological:     Mental Status: He is alert and oriented to person, place, and time.     Coordination: Coordination normal.     Vitals:   05/26/59 1050 05/26/59 1116  BP: (!) 170/108 (!) 160/102  Pulse: 84   Temp: 98.5 F (36.9 C)   TempSrc: Oral   SpO2: 96%   Weight: 283 lb (128.4 kg)   Height: 6\' 2"  (1.88 m)    EKG: Rate 68, normal axis, normal interval, sinus, no st/t wave changes, when compared to 2017 there are no significant changes  Assessment & Plan:

## 2019-05-26 NOTE — Assessment & Plan Note (Signed)
Rx for gabapentin for pain.

## 2019-05-26 NOTE — Assessment & Plan Note (Signed)
Counseled to stop smoking. His consumption is down and reviewed the risks of smoking to his health especially combined with uncontrolled hypertension. He does not feel able to make an attempt to quit at this time.

## 2019-06-15 ENCOUNTER — Telehealth: Payer: Self-pay

## 2019-06-15 NOTE — Telephone Encounter (Signed)
LVM for patient to call back and see if he would be willing to move appointment to 11:00 as long as slot is still available. Also need to screen patient and make sure still has check in number

## 2019-06-16 ENCOUNTER — Ambulatory Visit: Payer: PRIVATE HEALTH INSURANCE | Admitting: Internal Medicine

## 2019-09-14 ENCOUNTER — Other Ambulatory Visit: Payer: Self-pay | Admitting: Internal Medicine

## 2019-09-14 DIAGNOSIS — I10 Essential (primary) hypertension: Secondary | ICD-10-CM

## 2019-10-20 ENCOUNTER — Other Ambulatory Visit: Payer: Self-pay | Admitting: Internal Medicine

## 2019-10-20 DIAGNOSIS — I10 Essential (primary) hypertension: Secondary | ICD-10-CM

## 2019-11-06 ENCOUNTER — Other Ambulatory Visit: Payer: Self-pay | Admitting: Internal Medicine

## 2019-11-06 DIAGNOSIS — I10 Essential (primary) hypertension: Secondary | ICD-10-CM

## 2019-12-27 ENCOUNTER — Other Ambulatory Visit: Payer: Self-pay | Admitting: Internal Medicine

## 2019-12-27 DIAGNOSIS — I10 Essential (primary) hypertension: Secondary | ICD-10-CM

## 2020-03-25 ENCOUNTER — Other Ambulatory Visit: Payer: Self-pay | Admitting: Internal Medicine

## 2020-03-25 DIAGNOSIS — G8929 Other chronic pain: Secondary | ICD-10-CM

## 2020-03-25 DIAGNOSIS — M5441 Lumbago with sciatica, right side: Secondary | ICD-10-CM

## 2020-05-24 ENCOUNTER — Other Ambulatory Visit: Payer: Self-pay | Admitting: Internal Medicine

## 2020-05-24 DIAGNOSIS — G8929 Other chronic pain: Secondary | ICD-10-CM

## 2020-07-18 ENCOUNTER — Encounter: Payer: Self-pay | Admitting: Internal Medicine

## 2020-07-18 ENCOUNTER — Ambulatory Visit (INDEPENDENT_AMBULATORY_CARE_PROVIDER_SITE_OTHER): Payer: PRIVATE HEALTH INSURANCE | Admitting: Internal Medicine

## 2020-07-18 ENCOUNTER — Other Ambulatory Visit: Payer: Self-pay

## 2020-07-18 VITALS — BP 128/82 | HR 74 | Temp 98.4°F | Ht 74.0 in | Wt 295.0 lb

## 2020-07-18 DIAGNOSIS — G8929 Other chronic pain: Secondary | ICD-10-CM

## 2020-07-18 DIAGNOSIS — Z720 Tobacco use: Secondary | ICD-10-CM

## 2020-07-18 DIAGNOSIS — M5441 Lumbago with sciatica, right side: Secondary | ICD-10-CM | POA: Diagnosis not present

## 2020-07-18 DIAGNOSIS — Z1159 Encounter for screening for other viral diseases: Secondary | ICD-10-CM

## 2020-07-18 DIAGNOSIS — M5416 Radiculopathy, lumbar region: Secondary | ICD-10-CM

## 2020-07-18 DIAGNOSIS — I10 Essential (primary) hypertension: Secondary | ICD-10-CM | POA: Diagnosis not present

## 2020-07-18 DIAGNOSIS — Z0000 Encounter for general adult medical examination without abnormal findings: Secondary | ICD-10-CM

## 2020-07-18 MED ORDER — LISINOPRIL-HYDROCHLOROTHIAZIDE 20-25 MG PO TABS: 1.0000 | ORAL_TABLET | Freq: Every day | ORAL | 3 refills | Status: DC

## 2020-07-18 MED ORDER — MELOXICAM 15 MG PO TABS
15.0000 mg | ORAL_TABLET | Freq: Every day | ORAL | 3 refills | Status: DC
Start: 2020-07-18 — End: 2020-11-09

## 2020-07-18 MED ORDER — GABAPENTIN 300 MG PO CAPS: 300.0000 mg | ORAL_CAPSULE | Freq: Three times a day (TID) | ORAL | 11 refills | Status: DC

## 2020-07-18 NOTE — Patient Instructions (Addendum)
We will have you stop the amlodipine.   We have sent in lisinopril/hctz to take instead of lisinopril alone to help blood pressure and swelling. This is 1 pill daily.  We have refilled the gabapentin and sent in meloxicam for pain also to take to help the legs.  We are checking the labs today.  We will have you try pepcid or omeprazole daily over the counter for heart burn. If this works let us know we can send in a prescription for either to use to prevent heartburn. It is okay to take rolaids as well with either of these.

## 2020-07-18 NOTE — Progress Notes (Signed)
   Subjective:   Patient ID: Devin [name], male    DOB: 1961-10-13, 59 y.o.   MRN: [id number]  HPI The patient is a 59 YO man coming in for follow up blood pressure (having some swelling in feet and legs, taking amlodipine and lisinopril recently, BP at goal, denies headaches or chest pains) and tobacco abuse (still smoking 1/2 PPD or less, denies being able to make an attempt to quit right now, denies SOB with activity or chronic cough) and lumbar radiculopathy (taking gabapentin TID and this helps some, still having some pain in the joints and needs something else he can take for that to help him continue to work, denies recent injury or overuse but having some gradual worsening).   Review of Systems  Constitutional: Negative.   HENT: Negative.   Eyes: Negative.   Respiratory: Negative for cough, chest tightness and shortness of breath.   Cardiovascular: Positive for leg swelling. Negative for chest pain and palpitations.  Gastrointestinal: Negative for abdominal distention, abdominal pain, constipation, diarrhea, nausea and vomiting.  Musculoskeletal: Positive for arthralgias and back pain.  Skin: Negative.   Neurological: Negative.   Psychiatric/Behavioral: Negative.     Objective:  Physical Exam Constitutional:      Appearance: He is well-developed.  HENT:     Head: Normocephalic and atraumatic.  Cardiovascular:     Rate and Rhythm: Normal rate and regular rhythm.  Pulmonary:     Effort: Pulmonary effort is normal. No respiratory distress.     Breath sounds: Normal breath sounds. No wheezing or rales.  Abdominal:     General: Bowel sounds are normal. There is no distension.     Palpations: Abdomen is soft.     Tenderness: There is no abdominal tenderness. There is no rebound.  Musculoskeletal:     Cervical back: Normal range of motion.  Skin:    General: Skin is warm and dry.  Neurological:     Mental Status: He is alert and oriented to person, place, and time.      Coordination: Coordination normal.     Vitals:   07/18/20 0848  BP: 128/82  Pulse: 74  Temp: 98.4 F (36.9 C)  TempSrc: Oral  SpO2: 96%  Weight: (!) 295 lb (133.8 kg)  Height: 6\' 2"  (1.88 m)    This visit occurred during the SARS-CoV-2 public health emergency.  Safety protocols were in place, including screening questions prior to the visit, additional usage of staff PPE, and extensive cleaning of exam room while observing appropriate contact time as indicated for disinfecting solutions.   Assessment & Plan:

## 2020-07-19 LAB — CBC
HCT: 43.2 % (ref 38.5–50.0)
Hemoglobin: 14 g/dL (ref 13.2–17.1)
MCH: 27.5 pg (ref 27.0–33.0)
MCHC: 32.4 g/dL (ref 32.0–36.0)
MCV: 84.7 fL (ref 80.0–100.0)
MPV: 11.1 fL (ref 7.5–12.5)
Platelets: 248 10*3/uL (ref 140–400)
RBC: 5.1 10*6/uL (ref 4.20–5.80)
RDW: 14.5 % (ref 11.0–15.0)
WBC: 7.7 10*3/uL (ref 3.8–10.8)

## 2020-07-19 LAB — HIV ANTIBODY (ROUTINE TESTING W REFLEX): HIV 1&2 Ab, 4th Generation: NONREACTIVE

## 2020-07-19 LAB — TSH: TSH: 1.49 mIU/L (ref 0.40–4.50)

## 2020-07-19 LAB — COMPREHENSIVE METABOLIC PANEL
AG Ratio: 1.4 (calc) (ref 1.0–2.5)
ALT: 15 U/L (ref 9–46)
AST: 17 U/L (ref 10–35)
Albumin: 4.1 g/dL (ref 3.6–5.1)
Alkaline phosphatase (APISO): 69 U/L (ref 35–144)
BUN: 17 mg/dL (ref 7–25)
CO2: 24 mmol/L (ref 20–32)
Calcium: 9.4 mg/dL (ref 8.6–10.3)
Chloride: 106 mmol/L (ref 98–110)
Creat: 0.86 mg/dL (ref 0.70–1.25)
Globulin: 3 g/dL (calc) (ref 1.9–3.7)
Glucose, Bld: 92 mg/dL (ref 65–99)
Potassium: 4.7 mmol/L (ref 3.5–5.3)
Sodium: 141 mmol/L (ref 135–146)
Total Bilirubin: 0.3 mg/dL (ref 0.2–1.2)
Total Protein: 7.1 g/dL (ref 6.1–8.1)

## 2020-07-19 LAB — LIPID PANEL
Cholesterol: 197 mg/dL (ref <200)
HDL: 50 mg/dL (ref >=40)
LDL Cholesterol (Calc): 128 mg/dL (calc) — ABNORMAL HIGH
Non-HDL Cholesterol (Calc): 147 mg/dL (calc) — ABNORMAL HIGH (ref <130)
Total CHOL/HDL Ratio: 3.9 (calc) (ref <5.0)
Triglycerides: 91 mg/dL (ref <150)

## 2020-07-19 LAB — HEPATITIS C ANTIBODY
Hepatitis C Ab: NONREACTIVE
SIGNAL TO CUT-OFF: 0.17 (ref <1.00)

## 2020-07-19 LAB — VITAMIN B12: Vitamin B-12: 520 pg/mL (ref 200–1100)

## 2020-07-19 LAB — HEMOGLOBIN A1C
Hgb A1c MFr Bld: 5.8 % of total Hgb — ABNORMAL HIGH (ref <5.7)
Mean Plasma Glucose: 120 (calc)
eAG (mmol/L): 6.6 (calc)

## 2020-07-20 NOTE — Assessment & Plan Note (Signed)
Discussed risk/harm from smoking and advised to quit. He is not ready to make attempt at this time.

## 2020-07-20 NOTE — Assessment & Plan Note (Signed)
Refill gabapentin and added meloxicam to try for pain.

## 2020-07-20 NOTE — Assessment & Plan Note (Signed)
BP at goal today. Stop amlodipine due to leg swelling and change lisinopril to lisinopril/hctz 20/25 mg daily. Checking CMP and adjust as needed.

## 2020-11-08 ENCOUNTER — Other Ambulatory Visit: Payer: Self-pay | Admitting: Internal Medicine

## 2020-11-09 ENCOUNTER — Other Ambulatory Visit: Payer: Self-pay

## 2020-11-09 MED ORDER — MELOXICAM 15 MG PO TABS: 15.0000 mg | ORAL_TABLET | Freq: Every day | ORAL | 3 refills | Status: DC

## 2021-02-07 ENCOUNTER — Encounter: Payer: Self-pay | Admitting: Internal Medicine

## 2021-02-07 ENCOUNTER — Ambulatory Visit (INDEPENDENT_AMBULATORY_CARE_PROVIDER_SITE_OTHER): Payer: PRIVATE HEALTH INSURANCE | Admitting: Internal Medicine

## 2021-02-07 ENCOUNTER — Other Ambulatory Visit: Payer: Self-pay

## 2021-02-07 ENCOUNTER — Ambulatory Visit (INDEPENDENT_AMBULATORY_CARE_PROVIDER_SITE_OTHER): Payer: PRIVATE HEALTH INSURANCE

## 2021-02-07 VITALS — BP 138/90 | HR 91 | Temp 97.8°F | Ht 74.0 in | Wt 299.0 lb

## 2021-02-07 DIAGNOSIS — R200 Anesthesia of skin: Secondary | ICD-10-CM

## 2021-02-07 DIAGNOSIS — D171 Benign lipomatous neoplasm of skin and subcutaneous tissue of trunk: Secondary | ICD-10-CM | POA: Diagnosis not present

## 2021-02-07 MED ORDER — CYCLOBENZAPRINE HCL 10 MG PO TABS
10.0000 mg | ORAL_TABLET | Freq: Three times a day (TID) | ORAL | 0 refills | Status: DC | PRN
Start: 2021-02-07 — End: 2022-05-31

## 2021-02-07 MED ORDER — PREDNISONE 20 MG PO TABS
40.0000 mg | ORAL_TABLET | Freq: Every day | ORAL | 0 refills | Status: DC
Start: 2021-02-07 — End: 2021-10-10

## 2021-02-07 NOTE — Patient Instructions (Addendum)
We have sent in prednisone to take 2 pills daily for 5 days to help.   We are checking an x-ray of the neck today.  We have sent in flexeril to use for pain up to 3 times a day for the pain.  We will get you in with a surgeon to take the spot off the back.

## 2021-02-07 NOTE — Assessment & Plan Note (Signed)
Given prior x-ray with cervical stenosis findings and bilateral arm pain and numbness suspect cervical radiculopathy etiology. Rx prednisone and flexeril to help with pain. Checking x-ray for changes and referral to neurosurgery if needed.

## 2021-02-07 NOTE — Assessment & Plan Note (Signed)
Referral to surgeon for removal as this is causing him pain.

## 2021-02-07 NOTE — Progress Notes (Signed)
   Subjective:   Patient ID: Devin [name], male    DOB: 01/16/1961, 60 y.o.   MRN: [id number]  HPI The patient is a 60 YO man coming in for problems with bilateral hand and arm pain and numbness. Started in the past few months and gradually worsening. Used to be he could sleep in certain positions and it was better. Now he cannot lie down at all or he gets severe pain and numbness. Is sleeping in chair sitting up as this does not cause pain. Denies weakness in the arms. Denies injury or overuse. Denies taking anything which has helped for the pain. Also has a spot on his back which is hurting and he would like removed if possible. Gradually growing over years. Not much growth recently. Denies itching or rash on skin to his knowledge. Has not tried anything for that.    Review of Systems  Constitutional: Negative.   HENT: Negative.   Eyes: Negative.   Respiratory: Negative for cough, chest tightness and shortness of breath.   Cardiovascular: Negative for chest pain, palpitations and leg swelling.  Gastrointestinal: Negative for abdominal distention, abdominal pain, constipation, diarrhea, nausea and vomiting.  Musculoskeletal: Positive for myalgias.  Skin: Negative.   Neurological: Positive for numbness. Negative for dizziness and weakness.  Psychiatric/Behavioral: Negative.     Objective:  Physical Exam Constitutional:      Appearance: He is well-developed and well-nourished.  HENT:     Head: Normocephalic and atraumatic.  Eyes:     Extraocular Movements: EOM normal.  Cardiovascular:     Rate and Rhythm: Normal rate and regular rhythm.  Pulmonary:     Effort: Pulmonary effort is normal. No respiratory distress.     Breath sounds: Normal breath sounds. No wheezing or rales.  Abdominal:     General: Bowel sounds are normal. There is no distension.     Palpations: Abdomen is soft.     Tenderness: There is no abdominal tenderness. There is no rebound.  Musculoskeletal:         General: Tenderness present. No edema.     Cervical back: Normal range of motion.     Comments: Some tenderness to palpation right mid back with lipoma, tenderness cervical spine as well  Skin:    General: Skin is warm and dry.  Neurological:     Mental Status: He is alert and oriented to person, place, and time.     Coordination: Coordination normal.  Psychiatric:        Mood and Affect: Mood and affect normal.     Vitals:   02/07/21 0942  BP: 138/90  Pulse: 91  Temp: 97.8 F (36.6 C)  TempSrc: Oral  SpO2: 95%  Weight: 299 lb (135.6 kg)  Height: 6\' 2"  (1.88 m)   This visit occurred during the SARS-CoV-2 public health emergency.  Safety protocols were in place, including screening questions prior to the visit, additional usage of staff PPE, and extensive cleaning of exam room while observing appropriate contact time as indicated for disinfecting solutions.   Assessment & Plan:

## 2021-03-06 ENCOUNTER — Other Ambulatory Visit: Payer: Self-pay | Admitting: Internal Medicine

## 2021-04-05 ENCOUNTER — Other Ambulatory Visit: Payer: Self-pay | Admitting: Internal Medicine

## 2021-05-18 ENCOUNTER — Other Ambulatory Visit: Payer: Self-pay | Admitting: Internal Medicine

## 2021-06-15 ENCOUNTER — Other Ambulatory Visit: Payer: Self-pay | Admitting: Internal Medicine

## 2021-07-06 ENCOUNTER — Emergency Department (HOSPITAL_COMMUNITY): Payer: 59

## 2021-07-06 ENCOUNTER — Emergency Department (HOSPITAL_COMMUNITY)
Admission: EM | Admit: 2021-07-06 | Discharge: 2021-07-06 | Disposition: A | Discharge status: Home / Self Care | Payer: 59 | Attending: Emergency Medicine | Admitting: Emergency Medicine

## 2021-07-06 ENCOUNTER — Encounter (HOSPITAL_COMMUNITY): Payer: Self-pay | Admitting: Emergency Medicine

## 2021-07-06 DIAGNOSIS — F17210 Nicotine dependence, cigarettes, uncomplicated: Secondary | ICD-10-CM | POA: Diagnosis not present

## 2021-07-06 DIAGNOSIS — I10 Essential (primary) hypertension: Secondary | ICD-10-CM | POA: Insufficient documentation

## 2021-07-06 DIAGNOSIS — S91311A Laceration without foreign body, right foot, initial encounter: Secondary | ICD-10-CM | POA: Insufficient documentation

## 2021-07-06 DIAGNOSIS — W171XXA Fall into storm drain or manhole, initial encounter: Secondary | ICD-10-CM | POA: Diagnosis not present

## 2021-07-06 DIAGNOSIS — Z79899 Other long term (current) drug therapy: Secondary | ICD-10-CM | POA: Insufficient documentation

## 2021-07-06 DIAGNOSIS — S99921A Unspecified injury of right foot, initial encounter: Secondary | ICD-10-CM | POA: Diagnosis present

## 2021-07-06 MED ORDER — NAPROXEN 250 MG PO TABS
500.0000 mg | ORAL_TABLET | Freq: Once | ORAL | Status: AC
  Administered 2021-07-06: 500 mg via ORAL
  Filled 2021-07-06: qty 2

## 2021-07-06 MED ORDER — DOXYCYCLINE HYCLATE 100 MG PO CAPS: 100.0000 mg | ORAL_CAPSULE | Freq: Two times a day (BID) | ORAL | 0 refills | Status: DC

## 2021-07-06 MED ORDER — NAPROXEN 250 MG PO TABS: 500.0000 mg | ORAL_TABLET | Freq: Once | ORAL | Status: DC

## 2021-07-06 MED ORDER — BACITRACIN ZINC 500 UNIT/GM EX OINT: 1.0000 "application " | TOPICAL_OINTMENT | Freq: Two times a day (BID) | CUTANEOUS | Status: DC

## 2021-07-06 MED ORDER — DOXYCYCLINE HYCLATE 100 MG PO TABS
100.0000 mg | ORAL_TABLET | Freq: Once | ORAL | Status: AC
  Administered 2021-07-06: 100 mg via ORAL
  Filled 2021-07-06: qty 1

## 2021-07-06 NOTE — Discharge Instructions (Addendum)
Please see your doctor within 1 week for a wound check as long as things are getting better Take doxycycline twice a day for antibiotics to treat potential early infection Your x-ray does not show anything abnormal, no obvious foreign bodies Because of the location of this laceration it may cause pain for quite some time but should gradually heal over the next couple of weeks Please try to stay off of your foot or use the postop shoe with crutches to help walk Apply a topical antibiotic twice a day and keep a clean sterile dressing over this wound Emergency department for severe or worsening symptoms

## 2021-07-06 NOTE — ED Triage Notes (Addendum)
Patient here for evaluation of right foot laceration that occurred three days ago when he stepped on a manhole cover that was not seated and injured his foot. Patient states the pain has increased since injury. Patient alert, oriented, and in no apparent distress at this time.

## 2021-07-06 NOTE — ED Provider Notes (Signed)
[facility] EMERGENCY DEPARTMENT Provider Note   CSN: [id number] Arrival date & time: 07/06/21  0748     History Chief Complaint  Patient presents with   Extremity Laceration    Devin [name] is a 60 y.o. male.  HPI  This patient is a 60 year old male, he presents to the hospital with a complaint of a laceration to the plantar aspect of the right foot which occurred 3 days ago, he was wearing flip-flops and stepped on a manhole cover, somehow this caused a approximate 4 cm laceration to the plantar aspect of his right foot in the arch, he cleaned it with alcohol, routine cleaning with soap and water but continues to have pain which seems to be increasing.  He denies fevers or swelling of the foot, he has had a small amount of discharge or drainage from the wound, symptoms are persistent, gradually worsening, he has not been evaluated for this up to this point.  Past Medical History:  Diagnosis Date   Gout    Hypertension     Patient Active Problem List   Diagnosis Date Noted   Numbness in both hands 02/07/2021   Lipoma of back 02/07/2021   Vitiligo 10/09/2017   Right lumbar radiculopathy 07/31/2017   ED (erectile dysfunction) 12/27/2015   Uncontrolled hypertension 12/27/2015   Hx of gout 12/27/2015   Obesity 12/27/2015   Tobacco abuse 12/27/2015    Past Surgical History:  Procedure Laterality Date   KNEE SURGERY         Family History  Problem Relation Age of Onset   Cancer Mother    Stroke Father    Colon cancer Neg Hx     Social History   Tobacco Use   Smoking status: Every Day    Packs/day: 0.30    Types: Cigarettes   Smokeless tobacco: Never  Substance Use Topics   Alcohol use: No   Drug use: No    Home Medications Prior to Admission medications   Medication Sig Start Date End Date Taking? Authorizing Provider  doxycycline (VIBRAMYCIN) 100 MG capsule Take 1 capsule (100 mg total) by mouth 2 (two) times daily. 07/06/21  Yes  Noemi Chapel, MD  cyclobenzaprine (FLEXERIL) 10 MG tablet Take 1 tablet (10 mg total) by mouth 3 (three) times daily as needed for muscle spasms. 02/07/21   Hoyt Koch, MD  gabapentin (NEURONTIN) 300 MG capsule Take 1 capsule (300 mg total) by mouth 3 (three) times daily. 07/18/20   Hoyt Koch, MD  lisinopril (ZESTRIL) 20 MG tablet Take 1 tablet (20 mg total) by mouth daily. 12/28/19   Hoyt Koch, MD  lisinopril-hydrochlorothiazide (ZESTORETIC) 20-25 MG tablet Take 1 tablet by mouth once daily 06/16/21   Hoyt Koch, MD  meloxicam [facility]) 15 MG tablet Take 1 tablet by mouth once daily 06/16/21   Hoyt Koch, MD  predniSONE (DELTASONE) 20 MG tablet Take 2 tablets (40 mg total) by mouth daily with breakfast. 02/07/21   Hoyt Koch, MD    Allergies    Patient has no known allergies.  Review of Systems   Review of Systems  Constitutional:  Negative for fever.  Skin:  Positive for wound.  Neurological:  Negative for weakness and numbness.   Physical Exam Updated Vital Signs BP (!) 134/94   Pulse 90   Temp 98.8 F (37.1 C) (Oral)   Resp 14   SpO2 96%   Physical Exam Vitals and nursing note reviewed.  Constitutional:      Appearance: He is well-developed. He is not diaphoretic.  HENT:     Head: Normocephalic and atraumatic.  Eyes:     General:        Right eye: No discharge.        Left eye: No discharge.     Conjunctiva/sclera: Conjunctivae normal.  Pulmonary:     Effort: Pulmonary effort is normal. No respiratory distress.  Musculoskeletal:        General: Tenderness and signs of injury present.  Skin:    General: Skin is warm and dry.     Findings: No erythema or rash.     Comments: For centimeter wound to the bottom of the right plantar aspect of the foot.  The patient has a small amount of purulence at the base of the wound but no surrounding erythema, minimal surrounding tenderness, no foul smell, no spreading  redness  Neurological:     Mental Status: He is alert.     Coordination: Coordination normal.     Comments: Normal sensation to the feet bilaterally    ED Results / Procedures / Treatments   Labs (all labs ordered are listed, but only abnormal results are displayed) Labs Reviewed - No data to display  EKG None  Radiology DG Foot Complete Right  Result Date: 07/06/2021 CLINICAL DATA:  Laceration plantar foot. Rule out foreign body or fracture EXAM: RIGHT FOOT COMPLETE - 3+ VIEW COMPARISON:  None. FINDINGS: Negative for fracture.  No foreign body. Advanced degenerative change in the first MTP joint with joint space narrowing and spurring. No erosion. IMPRESSION: Negative for acute fracture or arthropathy. Electronically Signed   By: Franchot Gallo M.D.   On: 07/06/2021 09:16    Procedures Procedures   Medications Ordered in ED Medications  bacitracin ointment 1 application (has no administration in time range)  naproxen (NAPROSYN) tablet 500 mg (500 mg Oral Given 07/06/21 9604)  doxycycline (VIBRA-TABS) tablet 100 mg (100 mg Oral Given 07/06/21 5409)    ED Course  I have reviewed the triage vital signs and the nursing notes.  Pertinent labs & imaging results that were available during my care of the patient were reviewed by me and considered in my medical decision making (see chart for details).    MDM Rules/Calculators/A&P                          Obtain x-ray Doxycycline and naproxen Postop shoe and crutches Patient agreeable  I do not see any clinical signs of foreign body Unable to repair wound given 3 days out granulation tissue is already apparent at the base of the wound  X-ray negative for foreign body fracture or other obvious injuries.  Vital signs reassuring, stable for discharge, patient understands discharge instructions and need for follow-up  Final Clinical Impression(s) / ED Diagnoses Final diagnoses:  Laceration of right foot, initial encounter    Rx  / DC Orders ED Discharge Orders          Ordered    doxycycline (VIBRAMYCIN) 100 MG capsule  2 times daily        07/06/21 0937             Noemi Chapel, MD 07/06/21 [phone]

## 2021-07-17 ENCOUNTER — Other Ambulatory Visit: Payer: Self-pay | Admitting: Internal Medicine

## 2021-08-15 ENCOUNTER — Other Ambulatory Visit: Payer: Self-pay | Admitting: Internal Medicine

## 2021-08-17 ENCOUNTER — Other Ambulatory Visit: Payer: Self-pay | Admitting: Internal Medicine

## 2021-08-17 DIAGNOSIS — G8929 Other chronic pain: Secondary | ICD-10-CM

## 2021-08-17 DIAGNOSIS — M5441 Lumbago with sciatica, right side: Secondary | ICD-10-CM

## 2021-08-24 ENCOUNTER — Other Ambulatory Visit: Payer: Self-pay | Admitting: Internal Medicine

## 2021-08-24 DIAGNOSIS — G8929 Other chronic pain: Secondary | ICD-10-CM

## 2021-08-30 ENCOUNTER — Telehealth: Payer: Self-pay

## 2021-08-30 DIAGNOSIS — M5441 Lumbago with sciatica, right side: Secondary | ICD-10-CM

## 2021-08-30 DIAGNOSIS — G8929 Other chronic pain: Secondary | ICD-10-CM

## 2021-08-30 NOTE — Telephone Encounter (Signed)
Pt was last seen in office Feb 2022. Ok to refill? F/U not specified in last office visit.

## 2021-08-30 NOTE — Telephone Encounter (Signed)
Please advise as the pt is asking for a medication refill for his gabapentin (NEURONTIN) 300 MG capsule and his lisinopril-hydrochlorothiazide (ZESTORETIC) 20-25 MG tablet.

## 2021-08-31 MED ORDER — LISINOPRIL-HYDROCHLOROTHIAZIDE 20-25 MG PO TABS: 1.0000 | ORAL_TABLET | Freq: Every day | ORAL | 0 refills | Status: DC

## 2021-08-31 MED ORDER — GABAPENTIN 300 MG PO CAPS: 300.0000 mg | ORAL_CAPSULE | Freq: Three times a day (TID) | ORAL | 0 refills | Status: DC

## 2021-08-31 NOTE — Addendum Note (Signed)
Addended by: Pricilla Holm A on: 08/31/2021 12:04 PM   Modules accepted: Orders

## 2021-08-31 NOTE — Telephone Encounter (Signed)
Sent in 90 days should schedule physical within that time frame.

## 2021-09-25 ENCOUNTER — Other Ambulatory Visit: Payer: Self-pay | Admitting: Internal Medicine

## 2021-10-10 ENCOUNTER — Ambulatory Visit (INDEPENDENT_AMBULATORY_CARE_PROVIDER_SITE_OTHER): Payer: 59 | Admitting: Internal Medicine

## 2021-10-10 ENCOUNTER — Encounter: Payer: Self-pay | Admitting: Internal Medicine

## 2021-10-10 ENCOUNTER — Other Ambulatory Visit: Payer: Self-pay

## 2021-10-10 VITALS — BP 134/80 | HR 84 | Resp 18 | Ht 74.0 in | Wt 292.0 lb

## 2021-10-10 DIAGNOSIS — N521 Erectile dysfunction due to diseases classified elsewhere: Secondary | ICD-10-CM | POA: Diagnosis not present

## 2021-10-10 DIAGNOSIS — Z23 Encounter for immunization: Secondary | ICD-10-CM | POA: Diagnosis not present

## 2021-10-10 DIAGNOSIS — I10 Essential (primary) hypertension: Secondary | ICD-10-CM | POA: Diagnosis not present

## 2021-10-10 DIAGNOSIS — M79671 Pain in right foot: Secondary | ICD-10-CM

## 2021-10-10 DIAGNOSIS — Z6837 Body mass index (BMI) 37.0-37.9, adult: Secondary | ICD-10-CM

## 2021-10-10 LAB — CBC
HCT: 45.6 % (ref 39.0–52.0)
Hemoglobin: 14.9 g/dL (ref 13.0–17.0)
MCHC: 32.6 g/dL (ref 30.0–36.0)
MCV: 85.7 fl (ref 78.0–100.0)
Platelets: 200 K/uL (ref 150.0–400.0)
RBC: 5.32 Mil/uL (ref 4.22–5.81)
RDW: 15.9 % — ABNORMAL HIGH (ref 11.5–15.5)
WBC: 7 K/uL (ref 4.0–10.5)

## 2021-10-10 LAB — COMPREHENSIVE METABOLIC PANEL WITH GFR
ALT: 15 U/L (ref 0–53)
AST: 16 U/L (ref 0–37)
Albumin: 4.2 g/dL (ref 3.5–5.2)
Alkaline Phosphatase: 55 U/L (ref 39–117)
BUN: 24 mg/dL — ABNORMAL HIGH (ref 6–23)
CO2: 28 meq/L (ref 19–32)
Calcium: 9.1 mg/dL (ref 8.4–10.5)
Chloride: 104 meq/L (ref 96–112)
Creatinine, Ser: 0.88 mg/dL (ref 0.40–1.50)
GFR: 92.43 mL/min
Glucose, Bld: 94 mg/dL (ref 70–99)
Potassium: 3.3 meq/L — ABNORMAL LOW (ref 3.5–5.1)
Sodium: 141 meq/L (ref 135–145)
Total Bilirubin: 0.2 mg/dL (ref 0.2–1.2)
Total Protein: 7.3 g/dL (ref 6.0–8.3)

## 2021-10-10 LAB — LIPID PANEL
Cholesterol: 200 mg/dL (ref 0–200)
HDL: 47 mg/dL (ref >39.00)
LDL Cholesterol: 139 mg/dL — ABNORMAL HIGH (ref 0–99)
NonHDL: 152.94
Total CHOL/HDL Ratio: 4
Triglycerides: 69 mg/dL (ref 0.0–149.0)
VLDL: 13.8 mg/dL (ref 0.0–40.0)

## 2021-10-10 LAB — HEMOGLOBIN A1C: Hgb A1c MFr Bld: 6.3 % (ref 4.6–6.5)

## 2021-10-10 MED ORDER — PREDNISONE 20 MG PO TABS: 40.0000 mg | ORAL_TABLET | Freq: Every day | ORAL | 0 refills | Status: DC

## 2021-10-10 MED ORDER — MELOXICAM 15 MG PO TABS: 15.0000 mg | ORAL_TABLET | Freq: Every day | ORAL | 1 refills | Status: DC

## 2021-10-10 NOTE — Assessment & Plan Note (Signed)
Checking HgA1c for screening.

## 2021-10-10 NOTE — Patient Instructions (Addendum)
We have sent in prednisone to take starting today 2 pills daily for 5 days to help reduce the pain in the foot.  We have also sent in a prescription for meloxicam which is 1 pill daily to help longer term with the pain in the foot.  We will get you in with the foot specialist to make inserts to help long term heal the problem so you don't have the pain.  We will also get you in with the urologist to talk to them about options.   We will do some labs today as it is time so we can talk about them at the physical.

## 2021-10-10 NOTE — Assessment & Plan Note (Signed)
Has tried viagra in the past without success. Wishes to see urology for definitive treatment options and referral done today. This is gradually worsening over time.

## 2021-10-10 NOTE — Assessment & Plan Note (Signed)
BP at goal on lisinopril/hctz total daily dosing 40/25 mg. In need of labs ordered CBC, CMP, lipid panel today.

## 2021-10-10 NOTE — Assessment & Plan Note (Signed)
Rx prednisone 5 day course and refilled meloxicam which he has not been taking. Referral to podiatry as I suspect he needs support for arches to help minimize pain long term. He is standing for long periods of time at his work.

## 2021-10-10 NOTE — Progress Notes (Signed)
   Subjective:   Patient ID: Devin [name], male    DOB: May 30, 1961, 60 y.o.   MRN: [id number]  HPI The patient is a 60 YO man coming in for concerns about right foot and ED and blood pressure.  Review of Systems  Constitutional: Negative.   HENT: Negative.    Eyes: Negative.   Respiratory:  Negative for cough, chest tightness and shortness of breath.   Cardiovascular:  Negative for chest pain, palpitations and leg swelling.  Gastrointestinal:  Negative for abdominal distention, abdominal pain, constipation, diarrhea, nausea and vomiting.  Genitourinary:        ED  Musculoskeletal:  Positive for arthralgias and myalgias.  Skin: Negative.   Neurological: Negative.   Psychiatric/Behavioral: Negative.     Objective:  Physical Exam Constitutional:      Appearance: He is well-developed.  HENT:     Head: Normocephalic and atraumatic.  Cardiovascular:     Rate and Rhythm: Normal rate and regular rhythm.  Pulmonary:     Effort: Pulmonary effort is normal. No respiratory distress.     Breath sounds: Normal breath sounds. No wheezing or rales.  Abdominal:     General: Bowel sounds are normal. There is no distension.     Palpations: Abdomen is soft.     Tenderness: There is no abdominal tenderness. There is no rebound.  Musculoskeletal:        General: Tenderness present.     Cervical back: Normal range of motion.  Skin:    General: Skin is warm and dry.  Neurological:     Mental Status: He is alert and oriented to person, place, and time.     Coordination: Coordination normal.    Vitals:   10/10/21 0841  BP: 134/80  Pulse: 84  Resp: 18  SpO2: 95%  Weight: 292 lb (132.5 kg)  Height: 6\' 2"  (1.88 m)    This visit occurred during the SARS-CoV-2 public health emergency.  Safety protocols were in place, including screening questions prior to the visit, additional usage of staff PPE, and extensive cleaning of exam room while observing appropriate contact time as indicated for  disinfecting solutions.   Assessment & Plan:  Flu shot given at visit

## 2021-10-19 ENCOUNTER — Ambulatory Visit (INDEPENDENT_AMBULATORY_CARE_PROVIDER_SITE_OTHER): Payer: 59 | Admitting: Podiatry

## 2021-10-19 ENCOUNTER — Other Ambulatory Visit: Payer: Self-pay

## 2021-10-19 ENCOUNTER — Ambulatory Visit: Payer: BLUE CROSS/BLUE SHIELD

## 2021-10-19 DIAGNOSIS — M7750 Other enthesopathy of unspecified foot: Secondary | ICD-10-CM

## 2021-10-19 DIAGNOSIS — Q828 Other specified congenital malformations of skin: Secondary | ICD-10-CM

## 2021-10-19 DIAGNOSIS — R52 Pain, unspecified: Secondary | ICD-10-CM

## 2021-10-19 NOTE — Patient Instructions (Signed)
Look for salicylic acid 45% or urea 40% cream or ointment and apply to the thickened dry skin / calluses. This can be bought over the counter, at a pharmacy or online such as [employer]. The aperture pads can also be found there (may be called corn pads)

## 2021-10-19 NOTE — Progress Notes (Signed)
  Subjective:  Patient ID: Devin [name], male    DOB: [birth year],  MRN: [id number]  Chief Complaint  Patient presents with   Callouses    Painful callus lesions right foot    60 y.o. male presents with the above complaint. History confirmed with patient.  Works on his feet in boots and night shift.  Lesions are very painful it is like he is walking on a rock or glass  Objective:  Physical Exam: warm, good capillary refill, no trophic changes or ulcerative lesions, normal DP and PT pulses, and normal sensory exam. Left Foot: normal exam, no swelling, tenderness, instability; ligaments intact, full range of motion of all ankle/foot joints Right Foot: 2 porokeratosis plantar midfoot   Assessment:   1. Porokeratosis   2. Pain      Plan:  Patient was evaluated and treated and all questions answered.  All symptomatic hyperkeratoses were safely debrided with a sterile #15 blade to patient's level of comfort without incident. We discussed preventative and palliative care of these lesions including supportive and accommodative shoegear, padding, prefabricated and custom molded accommodative orthoses, use of a pumice stone and lotions/creams daily.  Aperture pads and salicylic acid cream or urea cream recommended and he will get this on [place]   Return if symptoms worsen or fail to improve.

## 2021-11-11 ENCOUNTER — Other Ambulatory Visit: Payer: Self-pay | Admitting: Internal Medicine

## 2021-11-29 ENCOUNTER — Ambulatory Visit (INDEPENDENT_AMBULATORY_CARE_PROVIDER_SITE_OTHER): Payer: 59 | Admitting: Podiatry

## 2021-11-29 ENCOUNTER — Encounter: Payer: Self-pay | Admitting: Podiatry

## 2021-11-29 ENCOUNTER — Other Ambulatory Visit: Payer: Self-pay | Admitting: Podiatry

## 2021-11-29 ENCOUNTER — Ambulatory Visit (INDEPENDENT_AMBULATORY_CARE_PROVIDER_SITE_OTHER): Payer: BLUE CROSS/BLUE SHIELD

## 2021-11-29 ENCOUNTER — Other Ambulatory Visit: Payer: Self-pay

## 2021-11-29 DIAGNOSIS — Q828 Other specified congenital malformations of skin: Secondary | ICD-10-CM

## 2021-11-29 DIAGNOSIS — M79673 Pain in unspecified foot: Secondary | ICD-10-CM

## 2021-11-29 NOTE — Progress Notes (Signed)
  Subjective:  Patient ID: Devin [name], male    DOB: 01-May-1961,   MRN: [id number]  Chief Complaint  Patient presents with   Foot Pain    PT states he has right foot pain in his arch area. Pt states that this has been going on for a month. Pt denies having any treatment.     60 y.o. male presents for concern of pain on the outside of his right foot. Relates he has had areas trimmed by Dr. Sherryle Lis several months ago and the pain has returned. Patient is not sure it is from the calluses  . Denies any other pedal complaints. Denies n/v/f/c.   Past Medical History:  Diagnosis Date   Gout    Hypertension     Objective:  Physical Exam: Vascular: DP/PT pulses 2/4 bilateral. CFT <3 seconds. Normal hair growth on digits. No edema.  Skin. No lacerations or abrasions bilateral feet. Two hyperkeratotic cored lesions noted to plantar lateral foot.  Musculoskeletal: MMT 5/5 bilateral lower extremities in DF, PF, Inversion and Eversion. Deceased ROM in DF of ankle joint.  Neurological: Sensation intact to light touch.   Assessment:   1. Porokeratosis      Plan:  Patient was evaluated and treated and all questions answered. X-rays reviewed and discussed with patient. No acute fractures or dislocations noted.   -Discussed corns and calluses with patient and treatment options.  -Hyperkeratotic tissue was debrided with chisel without incident.  -Applied salycylic acid treatment to area with dressing. Advised to remove bandaging tomorrow.  -Encouraged daily moisturizing -Discussed use of pumice stone -Advised good supportive shoes and inserts -Patient to return to office as needed or sooner if condition worsens.   Lorenda Peck, DPM

## 2021-12-28 ENCOUNTER — Other Ambulatory Visit: Payer: Self-pay | Admitting: Internal Medicine

## 2021-12-28 DIAGNOSIS — G8929 Other chronic pain: Secondary | ICD-10-CM

## 2021-12-30 ENCOUNTER — Encounter: Payer: Self-pay | Admitting: Internal Medicine

## 2022-02-09 ENCOUNTER — Other Ambulatory Visit: Payer: Self-pay | Admitting: Internal Medicine

## 2022-02-09 DIAGNOSIS — G8929 Other chronic pain: Secondary | ICD-10-CM

## 2022-02-09 DIAGNOSIS — M5441 Lumbago with sciatica, right side: Secondary | ICD-10-CM

## 2022-03-29 ENCOUNTER — Other Ambulatory Visit: Payer: Self-pay | Admitting: Internal Medicine

## 2022-05-31 ENCOUNTER — Ambulatory Visit (INDEPENDENT_AMBULATORY_CARE_PROVIDER_SITE_OTHER): Payer: PRIVATE HEALTH INSURANCE | Admitting: Internal Medicine

## 2022-05-31 ENCOUNTER — Encounter: Payer: Self-pay | Admitting: Internal Medicine

## 2022-05-31 VITALS — BP 180/112 | HR 82 | Temp 98.7°F | Ht 74.0 in | Wt 274.0 lb

## 2022-05-31 DIAGNOSIS — I10 Essential (primary) hypertension: Secondary | ICD-10-CM | POA: Diagnosis not present

## 2022-05-31 DIAGNOSIS — M67471 Ganglion, right ankle and foot: Secondary | ICD-10-CM

## 2022-05-31 DIAGNOSIS — Z720 Tobacco use: Secondary | ICD-10-CM

## 2022-05-31 MED ORDER — MELOXICAM 15 MG PO TABS: 15.0000 mg | ORAL_TABLET | Freq: Every day | ORAL | 0 refills | Status: DC

## 2022-05-31 MED ORDER — LISINOPRIL-HYDROCHLOROTHIAZIDE 20-25 MG PO TABS: 1.0000 | ORAL_TABLET | Freq: Every day | ORAL | 3 refills | Status: DC

## 2022-05-31 MED ORDER — AMLODIPINE BESYLATE 5 MG PO TABS: 5.0000 mg | ORAL_TABLET | Freq: Every day | ORAL | 3 refills | Status: DC

## 2022-05-31 NOTE — Patient Instructions (Addendum)
Please take all new medication as prescribed - the amlodipine 5 mg per day for blood pressure  Please continue all other medications as before, and refills have been done if requested - including the mobic 15 mg as needed for the right foot cyst, or the OTC Voltaren gel for the foot pain as well as needed  Please stop smoking  You will be contacted regarding the referral for: podiatry - asap  Please have the pharmacy call with any other refills you may need.  Please continue your efforts at being more active, low cholesterol diet, and weight control.  Please keep your appointments with your specialists as you may have planned  Please make an Appointment to return in 3 months, or sooner if needed, to Dr Sharlet Salina for BP check

## 2022-05-31 NOTE — Progress Notes (Unsigned)
Patient ID: Devin [name], male   DOB: [birth year], 61 y.o.   MRN: [id number]        Chief Complaint: follow up HTN, right foot pain mass, smoking       HPI:  Devin [name] is a 61 y.o. male here with c/o 1 mo worsening pain to a mass swelling area to the right plantar foot, mod to occasionally severe with having to stand all day at work full time with steel toed boots, cant stand the pain anymore, nothing else makes better or worse.  No hx of trauma.  Pt denies chest pain, increased sob or doe, wheezing, orthopnea, PND, increased LE swelling, palpitations, dizziness or syncope.   Pt denies polydipsia, polyuria, or new focal neuro s/s.    Pt denies fever, wt loss, night sweats, loss of appetite, or other constitutional symptoms  Still smoking, not ready to quit.  BP has been mild elevated elsewhere as well similar to today       Wt Readings from Last 3 Encounters:  05/31/22 274 lb (124.3 kg)  10/10/21 292 lb (132.5 kg)  02/07/21 299 lb (135.6 kg)   BP Readings from Last 3 Encounters:  05/31/22 (!) 180/112  10/10/21 134/80  07/06/21 (!) 148/88         Past Medical History:  Diagnosis Date   Gout    Hypertension    Past Surgical History:  Procedure Laterality Date   KNEE SURGERY      reports that he has been smoking cigarettes. He has been smoking an average of .3 packs per day. He has never used smokeless tobacco. He reports that he does not drink alcohol and does not use drugs. family history includes Cancer in his mother; Stroke in his father. No Known Allergies Current Outpatient Medications on File Prior to Visit  Medication Sig Dispense Refill   gabapentin (NEURONTIN) 300 MG capsule TAKE 1 CAPSULE BY MOUTH THREE TIMES DAILY 270 capsule 0   No current facility-administered medications on file prior to visit.        ROS:  All others reviewed and negative.  Objective        PE:  BP (!) 180/112 (BP Location: Left Arm, Patient Position: Sitting, Cuff Size: Large)   Pulse 82    Temp 98.7 F (37.1 C) (Oral)   Ht '6\' 2"'$  (1.88 m)   Wt 274 lb (124.3 kg)   SpO2 94%   BMI 35.18 kg/m                 Constitutional: Pt appears in NAD               HENT: Head: NCAT.                Right Ear: External ear normal.                 Left Ear: External ear normal.                Eyes: . Pupils are equal, round, and reactive to light. Conjunctivae and EOM are normal               Nose: without d/c or deformity               Neck: Neck supple. Gross normal ROM               Cardiovascular: Normal rate and regular rhythm.  Pulmonary/Chest: Effort normal and breath sounds without rales or wheezing.                Abd:  Soft, NT, ND, + BS, no organomegaly               Neurological: Pt is alert. At baseline orientation, motor grossly intact               Skin: LE edema - none; right plantar foot with 1 cm area tender firm mass to 4th tendon at mid foot it seems, no overlying skin cahnge               Psychiatric: Pt behavior is normal without agitation   Micro: none  Cardiac tracings I have personally interpreted today:  none  Pertinent Radiological findings (summarize): none   Lab Results  Component Value Date   WBC 7.0 10/10/2021   HGB 14.9 10/10/2021   HCT 45.6 10/10/2021   PLT 200.0 10/10/2021   GLUCOSE 94 10/10/2021   CHOL 200 10/10/2021   TRIG 69.0 10/10/2021   HDL 47.00 10/10/2021   LDLDIRECT 111.0 05/26/2019   LDLCALC 139 (H) 10/10/2021   ALT 15 10/10/2021   AST 16 10/10/2021   NA 141 10/10/2021   K 3.3 (L) 10/10/2021   CL 104 10/10/2021   CREATININE 0.88 10/10/2021   BUN 24 (H) 10/10/2021   CO2 28 10/10/2021   TSH 1.49 07/18/2020   INR 1.0 09/14/2008   HGBA1C 6.3 10/10/2021   Assessment/Plan:  Devin [name] is a 61 y.o. Black or African American [2] male with  has a past medical history of Gout and Hypertension.  Tobacco abuse Pt counsled to quit, pt not ready  Ganglion cyst of right foot Mod to occasionally severe symptomatic -  for podiatry referral  Essential hypertension BP Readings from Last 3 Encounters:  05/31/22 (!) 180/112  10/10/21 134/80  07/06/21 (!) 148/88   uncontrolled, pt to continue medical treatment zestoretic 20-25, but also add norvasc 5 qd, and f/u PCP 3 month  Followup: Return in about 3 months (around 08/31/2022) for to PCP.  Cathlean Cower, MD 06/02/2022 4:09 PM [facility]

## 2022-06-02 ENCOUNTER — Encounter: Payer: Self-pay | Admitting: Internal Medicine

## 2022-06-02 NOTE — Assessment & Plan Note (Signed)
Pt counsled to quit, pt not ready °

## 2022-06-02 NOTE — Assessment & Plan Note (Signed)
BP Readings from Last 3 Encounters:  05/31/22 (!) 180/112  10/10/21 134/80  07/06/21 (!) 148/88   uncontrolled, pt to continue medical treatment zestoretic 20-25, but also add norvasc 5 qd, and f/u PCP 3 month

## 2022-06-02 NOTE — Assessment & Plan Note (Signed)
Mod to occasionally severe symptomatic - for podiatry referral

## 2022-06-08 ENCOUNTER — Ambulatory Visit (INDEPENDENT_AMBULATORY_CARE_PROVIDER_SITE_OTHER): Payer: Commercial Managed Care - HMO | Admitting: Podiatry

## 2022-06-08 DIAGNOSIS — D492 Neoplasm of unspecified behavior of bone, soft tissue, and skin: Secondary | ICD-10-CM

## 2022-06-08 DIAGNOSIS — M79671 Pain in right foot: Secondary | ICD-10-CM

## 2022-06-08 MED ORDER — UREA 40 % EX CREA: 1.0000 | TOPICAL_CREAM | Freq: Every day | CUTANEOUS | 1 refills | Status: DC

## 2022-06-08 NOTE — Patient Instructions (Signed)
I have sent UREA 40% CREAM to the pharmacy for you. If you don't get it from the pharmacy you can purchse this over the counter or on [employer].

## 2022-06-11 NOTE — Progress Notes (Signed)
Subjective: 61 year old male presents the office today with concerns of a painful skin lesion of the bottom of his right foot.  He does have a remote history of injury to the foot where he cut his foot open.  He has had a callus, skin lesion present previously.  His previously had the area trimmed and salicylic acid applied minimal improvement.  It helps for short time.  Objective: AAO x3, NAD DP/PT pulses palpable bilaterally, CRT less than 3 seconds On the plantar aspect midfoot is a punctate annular hyperkeratotic lesion.  There is no ongoing ulceration drainage or signs of infection.  No foreign body.  Scar present prior injury along this area as well.  No pain with calf compression, swelling, warmth, erythema  Assessment: Porokeratosis, skin lesion right foot  Plan: -All treatment options discussed with the patient including all alternatives, risks, complications.  -Sharply debrided lesion without any complications or bleeding.  Skin was cleaned with alcohol and a pad was placed followed by salicylic acid and a bandage.  Postprocedure instructions discussed.  Monitor for any signs or symptoms of infection. -Urea 40% cream -Patient encouraged to call the office with any questions, concerns, change in symptoms.   Trula Slade DPM

## 2022-07-10 ENCOUNTER — Encounter: Payer: Self-pay | Admitting: Internal Medicine

## 2022-07-10 ENCOUNTER — Ambulatory Visit (INDEPENDENT_AMBULATORY_CARE_PROVIDER_SITE_OTHER): Payer: PRIVATE HEALTH INSURANCE | Admitting: Internal Medicine

## 2022-07-10 VITALS — BP 130/82 | HR 102 | Resp 18 | Ht 74.0 in | Wt 268.0 lb

## 2022-07-10 DIAGNOSIS — M1A9XX0 Chronic gout, unspecified, without tophus (tophi): Secondary | ICD-10-CM | POA: Insufficient documentation

## 2022-07-10 DIAGNOSIS — Z860100 Personal history of colon polyps, unspecified: Secondary | ICD-10-CM

## 2022-07-10 DIAGNOSIS — M109 Gout, unspecified: Secondary | ICD-10-CM

## 2022-07-10 DIAGNOSIS — Z0001 Encounter for general adult medical examination with abnormal findings: Secondary | ICD-10-CM

## 2022-07-10 DIAGNOSIS — Z720 Tobacco use: Secondary | ICD-10-CM

## 2022-07-10 DIAGNOSIS — I10 Essential (primary) hypertension: Secondary | ICD-10-CM

## 2022-07-10 DIAGNOSIS — R7303 Prediabetes: Secondary | ICD-10-CM

## 2022-07-10 DIAGNOSIS — R200 Anesthesia of skin: Secondary | ICD-10-CM

## 2022-07-10 LAB — COMPREHENSIVE METABOLIC PANEL WITH GFR
ALT: 13 U/L (ref 0–53)
AST: 18 U/L (ref 0–37)
Albumin: 4.2 g/dL (ref 3.5–5.2)
Alkaline Phosphatase: 55 U/L (ref 39–117)
BUN: 17 mg/dL (ref 6–23)
CO2: 27 meq/L (ref 19–32)
Calcium: 8.8 mg/dL (ref 8.4–10.5)
Chloride: 100 meq/L (ref 96–112)
Creatinine, Ser: 0.99 mg/dL (ref 0.40–1.50)
GFR: 81.45 mL/min
Glucose, Bld: 104 mg/dL — ABNORMAL HIGH (ref 70–99)
Potassium: 3 meq/L — ABNORMAL LOW (ref 3.5–5.1)
Sodium: 137 meq/L (ref 135–145)
Total Bilirubin: 0.3 mg/dL (ref 0.2–1.2)
Total Protein: 7.4 g/dL (ref 6.0–8.3)

## 2022-07-10 LAB — CBC
HCT: 42.8 % (ref 39.0–52.0)
Hemoglobin: 14.2 g/dL (ref 13.0–17.0)
MCHC: 33.2 g/dL (ref 30.0–36.0)
MCV: 84.8 fl (ref 78.0–100.0)
Platelets: 173 10*3/uL (ref 150.0–400.0)
RBC: 5.04 Mil/uL (ref 4.22–5.81)
RDW: 14.7 % (ref 11.5–15.5)
WBC: 6.1 10*3/uL (ref 4.0–10.5)

## 2022-07-10 LAB — HEMOGLOBIN A1C: Hgb A1c MFr Bld: 6.7 % — ABNORMAL HIGH (ref 4.6–6.5)

## 2022-07-10 LAB — LIPID PANEL
Cholesterol: 186 mg/dL (ref 0–200)
HDL: 41.6 mg/dL
LDL Cholesterol: 130 mg/dL — ABNORMAL HIGH (ref 0–99)
NonHDL: 144.26
Total CHOL/HDL Ratio: 4
Triglycerides: 72 mg/dL (ref 0.0–149.0)
VLDL: 14.4 mg/dL (ref 0.0–40.0)

## 2022-07-10 MED ORDER — KETOROLAC TROMETHAMINE 30 MG/ML IJ SOLN
30.0000 mg | Freq: Once | INTRAMUSCULAR | Status: AC
  Administered 2022-07-10: 30 mg via INTRAMUSCULAR

## 2022-07-10 MED ORDER — PREDNISONE 20 MG PO TABS: 40.0000 mg | ORAL_TABLET | Freq: Every day | ORAL | 0 refills | Status: DC

## 2022-07-10 NOTE — Assessment & Plan Note (Signed)
Smoking less and reminded to quit when able. Counseled about risk/harm from smoking.

## 2022-07-10 NOTE — Progress Notes (Signed)
   Subjective:   Patient ID: LUISDANIEL KENTON, male    DOB: 02/16/1961, 61 y.o.   MRN: [id number]  HPI The patient is a 61 YO man coming in for problems with feet and wanting physical.   PMH, [place], social history reviewed and updated  Review of Systems  Constitutional: Negative.   HENT: Negative.    Eyes: Negative.   Respiratory:  Negative for cough, chest tightness and shortness of breath.   Cardiovascular:  Negative for chest pain, palpitations and leg swelling.  Gastrointestinal:  Negative for abdominal distention, abdominal pain, constipation, diarrhea, nausea and vomiting.  Musculoskeletal:  Positive for arthralgias and myalgias.  Skin: Negative.   Neurological: Negative.   Psychiatric/Behavioral: Negative.      Objective:  Physical Exam Constitutional:      Appearance: He is well-developed.  HENT:     Head: Normocephalic and atraumatic.  Cardiovascular:     Rate and Rhythm: Normal rate and regular rhythm.  Pulmonary:     Effort: Pulmonary effort is normal. No respiratory distress.     Breath sounds: Normal breath sounds. No wheezing or rales.  Abdominal:     General: Bowel sounds are normal. There is no distension.     Palpations: Abdomen is soft.     Tenderness: There is no abdominal tenderness. There is no rebound.  Musculoskeletal:        General: Tenderness present.     Cervical back: Normal range of motion.     Comments: Left great toe with redness and tenderness  Skin:    General: Skin is warm and dry.  Neurological:     Mental Status: He is alert and oriented to person, place, and time.     Coordination: Coordination normal.     Vitals:   07/10/22 0852  BP: 130/82  Pulse: (!) 102  Resp: 18  SpO2: 92%  Weight: 268 lb (121.6 kg)  Height: '6\' 2"'$  (1.88 m)    Assessment & Plan:  Toradol 30 mg IM given at visit

## 2022-07-10 NOTE — Assessment & Plan Note (Signed)
Rx prednisone 5 day course and given toradol 30 mg IM today. Will not check uric acid given concurrent flare.

## 2022-07-10 NOTE — Assessment & Plan Note (Signed)
Checking HgA1c today and adjust as needed.

## 2022-07-10 NOTE — Assessment & Plan Note (Signed)
Using gabapentin 300 mg TID and will continue as this is effective without side effects.

## 2022-07-10 NOTE — Patient Instructions (Addendum)
We have given you the toradol shot today to help with pain. We have sent in prescription for prednisone to take 2 pills daily for 5 days so start that today.  The GI doctor will contact you about doing the colon cancer screening.

## 2022-07-10 NOTE — Assessment & Plan Note (Addendum)
Flu shot yearly. Covid-19 counseled. Shingrix counseled states got at pharmacy. Tetanus up to date. Colonoscopy referral to GI done as due. Counseled about sun safety and mole surveillance. Counseled about the dangers of distracted driving. Given 10 year screening recommendations.

## 2022-07-10 NOTE — Assessment & Plan Note (Signed)
BP at goal on amlodipine 5 mg daily and lisinopril/hctz 20/25 daily and continue. Checking CBC, CMP, lipid panel and adjust as needed.

## 2022-08-06 ENCOUNTER — Ambulatory Visit: Payer: Commercial Managed Care - HMO | Admitting: Podiatry

## 2022-12-05 ENCOUNTER — Encounter: Payer: Self-pay | Admitting: Internal Medicine

## 2022-12-05 ENCOUNTER — Ambulatory Visit (INDEPENDENT_AMBULATORY_CARE_PROVIDER_SITE_OTHER): Payer: PRIVATE HEALTH INSURANCE | Admitting: Internal Medicine

## 2022-12-05 VITALS — BP 180/120 | HR 65 | Temp 98.1°F | Ht 74.0 in | Wt 248.0 lb

## 2022-12-05 DIAGNOSIS — M5441 Lumbago with sciatica, right side: Secondary | ICD-10-CM | POA: Diagnosis not present

## 2022-12-05 DIAGNOSIS — G8929 Other chronic pain: Secondary | ICD-10-CM | POA: Diagnosis not present

## 2022-12-05 DIAGNOSIS — I10 Essential (primary) hypertension: Secondary | ICD-10-CM | POA: Diagnosis not present

## 2022-12-05 DIAGNOSIS — M5416 Radiculopathy, lumbar region: Secondary | ICD-10-CM | POA: Diagnosis not present

## 2022-12-05 DIAGNOSIS — Z23 Encounter for immunization: Secondary | ICD-10-CM

## 2022-12-05 DIAGNOSIS — N521 Erectile dysfunction due to diseases classified elsewhere: Secondary | ICD-10-CM

## 2022-12-05 MED ORDER — MELOXICAM 15 MG PO TABS
15.0000 mg | ORAL_TABLET | Freq: Every day | ORAL | 3 refills | Status: DC
Start: 2022-12-05 — End: 2024-10-19

## 2022-12-05 MED ORDER — KETOROLAC TROMETHAMINE 30 MG/ML IJ SOLN
30.0000 mg | Freq: Once | INTRAMUSCULAR | Status: AC
  Administered 2022-12-05: 30 mg via INTRAMUSCULAR

## 2022-12-05 MED ORDER — GABAPENTIN 300 MG PO CAPS: 300.0000 mg | ORAL_CAPSULE | Freq: Three times a day (TID) | ORAL | 3 refills | Status: DC

## 2022-12-05 MED ORDER — PREDNISONE 20 MG PO TABS: 40.0000 mg | ORAL_TABLET | Freq: Every day | ORAL | 0 refills | Status: DC

## 2022-12-05 MED ORDER — TADALAFIL 20 MG PO TABS: 20.0000 mg | ORAL_TABLET | ORAL | 11 refills | Status: DC | PRN

## 2022-12-05 MED ORDER — AMLODIPINE BESYLATE 5 MG PO TABS: 5.0000 mg | ORAL_TABLET | Freq: Every day | ORAL | 3 refills | Status: DC

## 2022-12-05 MED ORDER — LISINOPRIL-HYDROCHLOROTHIAZIDE 20-25 MG PO TABS: 1.0000 | ORAL_TABLET | Freq: Every day | ORAL | 3 refills | Status: DC

## 2022-12-05 MED ORDER — METHYLPREDNISOLONE ACETATE 40 MG/ML IJ SUSP
40.0000 mg | Freq: Once | INTRAMUSCULAR | Status: AC
  Administered 2022-12-05: 40 mg via INTRAMUSCULAR

## 2022-12-05 NOTE — Patient Instructions (Addendum)
We have given you the steroid shot and the toradol shot to help with the pain. We have sent in gabapentin to take up to 3 times a day for pain. We have sent in meloxicam to take daily for pain.  We have sent in prednisone (steroids) to take 2 pills daily for 5 days to help with the back.  We have sent in cialis to try as well. Use 1-2 as needed and we will get you in with the urologist to help.

## 2022-12-05 NOTE — Progress Notes (Unsigned)
   Subjective:   Patient ID: Devin [name], male    DOB: 02/03/1961, 61 y.o.   MRN: [id number]  HPI The patient is a 61 YO man coming in for low back pain  Review of Systems  Objective:  Physical Exam  Vitals:   12/05/22 0912 12/05/22 0920  BP: (!) 180/120 (!) 180/120  Pulse: 65   Temp: 98.1 F (36.7 C)   TempSrc: Oral   SpO2: 98%   Weight: 248 lb (112.5 kg)   Height: '6\' 2"'$  (1.88 m)     Assessment & Plan:  Flu shot, depo-medrol 40 mg IM and toradol 30 mg IM given at visit

## 2022-12-06 ENCOUNTER — Encounter: Payer: Self-pay | Admitting: Internal Medicine

## 2022-12-06 NOTE — Assessment & Plan Note (Signed)
With flare in last few days which is severe. Given depo-medrol 40 mg IM and toradol 30 mg IM today. Rx prednisone 40 mg daily and gabapentin 300 mg TID which he has been out of for some time. Can take tylenol as well avoid NSAIDs due to high BP.

## 2022-12-06 NOTE — Assessment & Plan Note (Signed)
Suspect blood pressure is contributing to symptoms and counseled to always take BP meds consistently. He is not on BP agent that would cause ED. Rx cialis 20 mg to use prn for erections. Counseled about side effects. Previously has tried viagra without success. Wishes to go to urology for definitive treatment referral done.

## 2022-12-06 NOTE — Assessment & Plan Note (Signed)
BP is severely high today. Previously was at goal on meds. Is in extreme pain from back today and did not take meds today yet. Counseled to not miss medications as this can cause damage from high BP. He does not want to change regimen. No clinical signs of hypertensive urgency or emergency. Take amlodipine 5 mg daily and lisinopril/hctz 20/25 as soon as possible and take consistently. Check BP and let us know.

## 2023-01-10 ENCOUNTER — Ambulatory Visit: Payer: PRIVATE HEALTH INSURANCE | Admitting: Internal Medicine

## 2023-01-21 ENCOUNTER — Other Ambulatory Visit: Payer: Self-pay

## 2023-01-21 ENCOUNTER — Emergency Department (HOSPITAL_COMMUNITY)
Admission: EM | Admit: 2023-01-21 | Discharge: 2023-01-22 | Discharge status: Against Medical Advice | Payer: Commercial Managed Care - HMO | Attending: Emergency Medicine | Admitting: Emergency Medicine

## 2023-01-21 ENCOUNTER — Emergency Department (HOSPITAL_COMMUNITY): Payer: Commercial Managed Care - HMO

## 2023-01-21 DIAGNOSIS — Z5321 Procedure and treatment not carried out due to patient leaving prior to being seen by health care provider: Secondary | ICD-10-CM | POA: Diagnosis not present

## 2023-01-21 DIAGNOSIS — R220 Localized swelling, mass and lump, head: Secondary | ICD-10-CM | POA: Insufficient documentation

## 2023-01-21 DIAGNOSIS — R079 Chest pain, unspecified: Secondary | ICD-10-CM | POA: Diagnosis not present

## 2023-01-21 DIAGNOSIS — R0602 Shortness of breath: Secondary | ICD-10-CM | POA: Diagnosis not present

## 2023-01-21 LAB — BASIC METABOLIC PANEL
Anion gap: 7 (ref 5–15)
BUN: 16 mg/dL (ref 8–23)
CO2: 24 mmol/L (ref 22–32)
Calcium: 8.6 mg/dL — ABNORMAL LOW (ref 8.9–10.3)
Chloride: 108 mmol/L (ref 98–111)
Creatinine, Ser: 0.82 mg/dL (ref 0.61–1.24)
GFR, Estimated: >60 mL/min (ref >60)
Glucose, Bld: 112 mg/dL — ABNORMAL HIGH (ref 70–99)
Potassium: 3.9 mmol/L (ref 3.5–5.1)
Sodium: 139 mmol/L (ref 135–145)

## 2023-01-21 LAB — CBC WITH DIFFERENTIAL/PLATELET
Abs Immature Granulocytes: 0.02 10*3/uL (ref 0.00–0.07)
Basophils Absolute: 0 10*3/uL (ref 0.0–0.1)
Basophils Relative: 1 %
Eosinophils Absolute: 0.1 10*3/uL (ref 0.0–0.5)
Eosinophils Relative: 2 %
HCT: 46 % (ref 39.0–52.0)
Hemoglobin: 14.7 g/dL (ref 13.0–17.0)
Immature Granulocytes: 0 %
Lymphocytes Relative: 32 %
Lymphs Abs: 2.5 10*3/uL (ref 0.7–4.0)
MCH: 28.3 pg (ref 26.0–34.0)
MCHC: 32 g/dL (ref 30.0–36.0)
MCV: 88.6 fL (ref 80.0–100.0)
Monocytes Absolute: 0.7 10*3/uL (ref 0.1–1.0)
Monocytes Relative: 8 %
Neutro Abs: 4.5 10*3/uL (ref 1.7–7.7)
Neutrophils Relative %: 57 %
Platelets: 282 10*3/uL (ref 150–400)
RBC: 5.19 MIL/uL (ref 4.22–5.81)
RDW: 16.1 % — ABNORMAL HIGH (ref 11.5–15.5)
WBC: 7.8 10*3/uL (ref 4.0–10.5)
nRBC: 0 % (ref 0.0–0.2)

## 2023-01-21 LAB — TROPONIN I (HIGH SENSITIVITY): Troponin I (High Sensitivity): 9 ng/L (ref <18)

## 2023-01-21 NOTE — ED Provider Triage Note (Signed)
Emergency Medicine Provider Triage Evaluation Note  Devin [name] , a 62 y.o. male  was evaluated in triage.  Pt complains of chest pain, shortness of breath.  States that same began 4 to 5 hours ago with pain throughout his chest and associated dyspnea on exertion.  Denies any history of similar symptoms previously.  Also endorses that the right side of his face is swelling and painful over the last few hours. Denies any dental pain or painful EOMs. No trauma.   Review of Systems  Positive:  Negative:   Physical Exam  BP (!) 187/126   Pulse 91   Temp 98.6 F (37 C) (Oral)   Resp 16   SpO2 96%  Gen:   Awake, no distress   Resp:  Normal effort  MSK:   Moves extremities without difficulty  Other:  Right cheek swelling and tenderness to palpation of the maxillary sinus area. Dentition poor throughout, no dental tenderness. EOMs intact without pain.  Medical Decision Making  Medically screening exam initiated at 10:46 PM.  Appropriate orders placed.  Devin [name] was informed that the remainder of the evaluation will be completed by another provider, this initial triage assessment does not replace that evaluation, and the importance of remaining in the ED until their evaluation is complete.     Bud Face, PA-C 01/21/23 2248

## 2023-01-21 NOTE — ED Triage Notes (Signed)
Patient reports central chest pain with mild SOB onset this afternoon , no emesis or diaphoresis . He adds mild right facial swelling this evening denies injury .

## 2023-01-22 LAB — TROPONIN I (HIGH SENSITIVITY): Troponin I (High Sensitivity): 8 ng/L (ref <18)

## 2023-01-22 NOTE — ED Notes (Signed)
Pt called and no response for vital check

## 2023-02-07 ENCOUNTER — Ambulatory Visit: Payer: Commercial Managed Care - HMO | Admitting: Internal Medicine

## 2023-02-07 ENCOUNTER — Encounter: Payer: Self-pay | Admitting: Internal Medicine

## 2023-02-07 VITALS — BP 112/80 | HR 89 | Temp 98.5°F | Ht 74.0 in | Wt 248.0 lb

## 2023-02-07 DIAGNOSIS — L600 Ingrowing nail: Secondary | ICD-10-CM | POA: Diagnosis not present

## 2023-02-07 DIAGNOSIS — M109 Gout, unspecified: Secondary | ICD-10-CM | POA: Diagnosis not present

## 2023-02-07 DIAGNOSIS — D171 Benign lipomatous neoplasm of skin and subcutaneous tissue of trunk: Secondary | ICD-10-CM | POA: Diagnosis not present

## 2023-02-07 MED ORDER — CYCLOBENZAPRINE HCL 5 MG PO TABS: 5.0000 mg | ORAL_TABLET | Freq: Three times a day (TID) | ORAL | 1 refills | Status: DC | PRN

## 2023-02-07 MED ORDER — PREDNISONE 20 MG PO TABS: 40.0000 mg | ORAL_TABLET | Freq: Every day | ORAL | 0 refills | Status: DC

## 2023-02-07 NOTE — Assessment & Plan Note (Signed)
Referral to general surgery for removal. This is growing and causing some discomfort with sitting.

## 2023-02-07 NOTE — Assessment & Plan Note (Addendum)
Acute problem. Rx prednisone 1 week course and referral to podiatry for removal/treatment of ingrowing nail

## 2023-02-07 NOTE — Patient Instructions (Addendum)
[facility] [place], [place] [id number] [phone]  We have sent in prednisone to take 2 pills daily for 1 week for the foot. We are going to get you in with a foot doctor to take care of the ingrowing nail to prevent this from coming back.  We have sent in cyclobenzaprine to take up to 3 times a day for pain as needed.

## 2023-02-07 NOTE — Progress Notes (Signed)
   Subjective:   Patient ID: Devin [name], male    DOB: 15-Sep-1961, 62 y.o.   MRN: [id number]  HPI The patient is a 62 YO man coming in for toe pain. Also lipoma on back is growing desires removal  Review of Systems  Constitutional: Negative.   HENT: Negative.    Eyes: Negative.   Respiratory:  Negative for cough, chest tightness and shortness of breath.   Cardiovascular:  Negative for chest pain, palpitations and leg swelling.  Gastrointestinal:  Negative for abdominal distention, abdominal pain, constipation, diarrhea, nausea and vomiting.  Musculoskeletal:  Positive for arthralgias, joint swelling and myalgias.  Skin: Negative.   Neurological: Negative.   Psychiatric/Behavioral: Negative.      Objective:  Physical Exam Constitutional:      Appearance: He is well-developed.  HENT:     Head: Normocephalic and atraumatic.  Cardiovascular:     Rate and Rhythm: Normal rate and regular rhythm.  Pulmonary:     Effort: Pulmonary effort is normal. No respiratory distress.     Breath sounds: Normal breath sounds. No wheezing or rales.  Abdominal:     General: Bowel sounds are normal. There is no distension.     Palpations: Abdomen is soft.     Tenderness: There is no abdominal tenderness. There is no rebound.  Musculoskeletal:        General: Tenderness present.     Cervical back: Normal range of motion.     Comments: Left great toe consistent with gout with pain and redness and tenderness  Skin:    General: Skin is warm and dry.  Neurological:     Mental Status: He is alert and oriented to person, place, and time.     Coordination: Coordination normal.     Vitals:   02/07/23 1036  BP: 112/80  Pulse: 89  Temp: 98.5 F (36.9 C)  TempSrc: Oral  SpO2: 98%  Weight: 248 lb (112.5 kg)  Height: '6\' 2"'$  (1.88 m)    Assessment & Plan:

## 2023-02-07 NOTE — Assessment & Plan Note (Signed)
Suspect this is predisposing to gout flare and referral to podiatry for assessment and treatment.

## 2023-02-18 ENCOUNTER — Ambulatory Visit: Payer: Commercial Managed Care - HMO | Admitting: Surgery

## 2023-02-22 DIAGNOSIS — D171 Benign lipomatous neoplasm of skin and subcutaneous tissue of trunk: Secondary | ICD-10-CM

## 2023-02-22 HISTORY — DX: Benign lipomatous neoplasm of skin and subcutaneous tissue of trunk: D17.1

## 2023-02-27 ENCOUNTER — Telehealth: Payer: Self-pay | Admitting: Surgery

## 2023-02-27 ENCOUNTER — Ambulatory Visit (INDEPENDENT_AMBULATORY_CARE_PROVIDER_SITE_OTHER): Payer: Commercial Managed Care - HMO | Admitting: Surgery

## 2023-02-27 ENCOUNTER — Encounter: Payer: Self-pay | Admitting: Surgery

## 2023-02-27 ENCOUNTER — Other Ambulatory Visit: Payer: Self-pay

## 2023-02-27 VITALS — BP 147/103 | HR 94 | Temp 98.9°F | Ht 74.0 in | Wt 254.0 lb

## 2023-02-27 DIAGNOSIS — D171 Benign lipomatous neoplasm of skin and subcutaneous tissue of trunk: Secondary | ICD-10-CM

## 2023-02-27 NOTE — Progress Notes (Signed)
02/27/2023  Reason for Visit:  Lipoma of back  Requesting Provider:  Pricilla Holm, MD  History of Present Illness: Devin [name] is a 62 y.o. male presenting for evaluation of a back lipoma.  The patient reports he's had this for a year, although on his chart, can see this had been addressed two years ago by his PCP.  A referral was made to General Surgery, but cannot see any appointments that were scheduled.  The patient reports that he feels the mass has grown in size.  It also causes him discomfort while at work.  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  Denies any redness of the skin, any drainage from the area.  Past Medical History: Past Medical History:  Diagnosis Date   Gout    Hypertension      Past Surgical History: Past Surgical History:  Procedure Laterality Date   KNEE SURGERY      Home Medications: Prior to Admission medications   Medication Sig Start Date End Date Taking? Authorizing Provider  amLODipine (NORVASC) 5 MG tablet Take 1 tablet (5 mg total) by mouth daily. 12/05/22  Yes Hoyt Koch, MD  cyclobenzaprine (FLEXERIL) 5 MG tablet Take 1 tablet (5 mg total) by mouth 3 (three) times daily as needed (pain). 02/07/23  Yes Hoyt Koch, MD  gabapentin (NEURONTIN) 300 MG capsule Take 1 capsule (300 mg total) by mouth 3 (three) times daily. 12/05/22  Yes Hoyt Koch, MD  lisinopril-hydrochlorothiazide (ZESTORETIC) 20-25 MG tablet Take 1 tablet by mouth daily. 12/05/22  Yes Hoyt Koch, MD  meloxicam (MOBIC) 15 MG tablet Take 1 tablet (15 mg total) by mouth daily. 12/05/22  Yes Hoyt Koch, MD  predniSONE (DELTASONE) 20 MG tablet Take 2 tablets (40 mg total) by mouth daily with breakfast. 02/07/23  Yes Hoyt Koch, MD  urea (CARMOL) 40 % CREA Apply 1 Application topically daily. 06/08/22  Yes Trula Slade, DPM    Allergies: No Known Allergies  Social  History:  reports that he has been smoking cigarettes. He has been smoking an average of .3 packs per day. He has never used smokeless tobacco. He reports that he does not drink alcohol and does not use drugs.   Family History: Family History  Problem Relation Age of Onset   Cancer Mother    Cancer Father    Stroke Father    Colon cancer Neg Hx     Review of Systems: Review of Systems  Constitutional:  Negative for chills and fever.  HENT:  Negative for hearing loss.   Respiratory:  Negative for shortness of breath.   Cardiovascular:  Negative for chest pain.  Gastrointestinal:  Negative for abdominal pain, nausea and vomiting.  Genitourinary:  Negative for dysuria.  Musculoskeletal:  Negative for myalgias.  Skin:        Mass on his right back.  Neurological:  Negative for dizziness.  Psychiatric/Behavioral:  Negative for depression.     Physical Exam BP (!) 147/103   Pulse 94   Temp 98.9 F (37.2 C) (Oral)   Ht '6\' 2"'$  (1.88 m)   Wt 254 lb (115.2 kg)   SpO2 98%   BMI 32.61 kg/m  CONSTITUTIONAL: No acute distress, well nourished. HEENT:  Normocephalic, atraumatic, extraocular motion intact. NECK: Trachea is midline, and there is no jugular venous distension.  RESPIRATORY:  Lungs are clear, and breath sounds are equal bilaterally. Normal  respiratory effort without pathologic use of accessory muscles. CARDIOVASCULAR: Heart is regular without murmurs, gallops, or rubs. MUSCULOSKELETAL:  Normal muscle strength and tone in all four extremities.  No peripheral edema or cyanosis. SKIN: The patient has a 4 cm mass around mid back, just to the right of the spinal column.  It is mobile, soft, non-tender.  No overlying skin changes or drainage.  NEUROLOGIC:  Motor and sensation is grossly normal.  Cranial nerves are grossly intact. PSYCH:  Alert and oriented to person, place and time. Affect is normal.  Laboratory Analysis: Labs from 01/21/23: Na 139, K 3.9, Cl 108, CO2 24, BUN 16,  Cr 0.82.  WBC 7.8, Hgb 14.7, Hct 46, Plt 282  Imaging: No results found.  Assessment and Plan: This is a 62 y.o. male with a mid back lipoma.  --Discussed with the patient that the mass he has is consistent with a lipoma.  It is slowly growing, is soft, somewhat mobile.  Discussed with him the options for excision in the office vs in the OR and he has opted for doing this in the OR where he can be under anesthesia and not be aware of what we're doing.   --Discussed with him then the surgical plan for excision of his back lipoma.  Reviewed the surgery at length with him including the planned incision, the risks of bleeding, infection, injury to surrounding structures, that this would be an outpatient surgery, post-operative pain control, and he's willing to proceed.   --Will schedule him for surgery on 03/13/23.  All of his questions have been answered.  I spent 40 minutes dedicated to the care of this patient on the date of this encounter to include pre-visit review of records, face-to-face time with the patient discussing diagnosis and management, and any post-visit coordination of care.   Melvyn Neth, [facility]

## 2023-02-27 NOTE — Patient Instructions (Addendum)
You have requested to have your lipoma removed. This will be done at [facility] with Dr. Hampton Abbot. We are looking at doing this on 03/13/23.  You will most likely be out of work 1 week for this surgery.  If you have FMLA or disability paperwork that needs filled out you may drop this off at our office or this can be faxed to [phone].  Please see the (blue)pre-care form that you have been given today. Our surgery scheduler will call you to verify surgery date and to go over information.   If you have any questions, please call our office.   Lipoma Removal  Lipoma removal is a surgical procedure to remove a lipoma, which is a noncancerous (benign) tumor that is made up of fat cells. Most lipomas are small and painless and do not require treatment. They can form in many areas of the body but are most common under the skin of the back, arms, shoulders, buttocks, and thighs. You may need lipoma removal if you have a lipoma that is large, growing, or causing discomfort. Lipoma removal may also be done for cosmetic reasons. Tell a health care provider about: Any allergies you have. All medicines you are taking, including vitamins, herbs, eye drops, creams, and over-the-counter medicines. Any problems you or family members have had with anesthetic medicines. Any bleeding problems you have. Any surgeries you have had. Any medical conditions you have. Whether you are pregnant or may be pregnant. What are the risks? Generally, this is a safe procedure. However, problems may occur, including: Infection. Bleeding. Scarring. Allergic reactions to medicines. Damage to nearby structures or organs, such as damage to nerves or blood vessels near the lipoma. What happens before the procedure? If you do not follow your health care provider's instructions, your procedure may be delayed or canceled. Medicines Ask your health care provider about: Changing or stopping your regular medicines.  This is especially important if you are taking diabetes medicines or blood thinners. Taking medicines such as aspirin and ibuprofen. These medicines can thin your blood. Do not take these medicines unless your health care provider tells you to take them. Taking over-the-counter medicines, vitamins, herbs, and supplements. General instructions You will have a physical exam. Your health care provider will check the size of the lipoma and whether it can be removed easily. You may have a biopsy and imaging tests, such as X-rays, a CT scan, and an MRI. Do not use any products that contain nicotine or tobacco for at least 4 weeks before the procedure. These products include cigarettes, chewing tobacco, and vaping devices, such as e-cigarettes. If you need help quitting, ask your health care provider. Ask your health care provider: How your surgery site will be marked. What steps will be taken to help prevent infection. These may include: Washing skin with a germ-killing soap. Taking antibiotic medicine. If you will be going home right after the procedure, plan to have a responsible adult: Take you home from the hospital or clinic. You will not be allowed to drive. Care for you for the time you are told. What happens during the procedure?  An IV will be inserted into one of your veins. You will be given one or more of the following: A medicine to help you relax (sedative). A medicine to numb the area (local anesthetic). A medicine to make you fall asleep (general anesthetic). A medicine that is injected into an area of your body to numb everything below the injection site (  regional anesthetic). An incision will be made into the skin over the lipoma or very near the lipoma. The incision may be made in a natural skin line or crease. Tissues, nerves, and blood vessels near the lipoma will be moved out of the way. The lipoma and the capsule that surrounds it will be separated from the surrounding  tissues. The lipoma will be removed. The incision may be closed with stitches (sutures). A bandage (dressing) will be placed over the incision. The procedure may vary among health care providers and hospitals. What happens after the procedure? Your blood pressure, heart rate, breathing rate, and blood oxygen level will be monitored until you leave the hospital or clinic. If you were prescribed an antibiotic medicine, use it as told by your health care provider. Do not stop using the antibiotic even if you start to feel better. If you were given a sedative during the procedure, it can affect you for several hours. Do not drive or operate machinery until your health care provider says that it is safe. Where to find more information OrthoInfo: orthoinfo.aaos.org Summary Before the procedure, follow instructions from your health care provider about eating and drinking, and changing or stopping your regular medicines. This is especially important if you are taking diabetes medicines or blood thinners. After the lipoma is removed, the incision may be closed with stitches (sutures) and covered with a bandage (dressing). If you were given a sedative during the procedure, it can affect you for several hours. Do not drive or operate machinery until your health care provider says that it is safe. This information is not intended to replace advice given to you by your health care provider. Make sure you discuss any questions you have with your health care provider. Document Revised: 12/29/2021 Document Reviewed: 12/29/2021 Elsevier Patient Education  [employer].

## 2023-02-27 NOTE — H&P (View-Only) (Signed)
02/27/2023  Reason for Visit:  Lipoma of back  Requesting Provider:  Elizabeth Crawford, MD  History of Present Illness: Devin [name] is a 62 y.o. male presenting for evaluation of a back lipoma.  The patient reports he's had this for a year, although on his chart, can see this had been addressed two years ago by his PCP.  A referral was made to General Surgery, but cannot see any appointments that were scheduled.  The patient reports that he feels the mass has grown in size.  It also causes him discomfort while at work.  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  Denies any redness of the skin, any drainage from the area.  Past Medical History: Past Medical History:  Diagnosis Date   Gout    Hypertension      Past Surgical History: Past Surgical History:  Procedure Laterality Date   KNEE SURGERY      Home Medications: Prior to Admission medications   Medication Sig Start Date End Date Taking? Authorizing Provider  amLODipine (NORVASC) 5 MG tablet Take 1 tablet (5 mg total) by mouth daily. 12/05/22  Yes Crawford, Elizabeth A, MD  cyclobenzaprine (FLEXERIL) 5 MG tablet Take 1 tablet (5 mg total) by mouth 3 (three) times daily as needed (pain). 02/07/23  Yes Crawford, Elizabeth A, MD  gabapentin (NEURONTIN) 300 MG capsule Take 1 capsule (300 mg total) by mouth 3 (three) times daily. 12/05/22  Yes Crawford, Elizabeth A, MD  lisinopril-hydrochlorothiazide (ZESTORETIC) 20-25 MG tablet Take 1 tablet by mouth daily. 12/05/22  Yes Crawford, Elizabeth A, MD  meloxicam (MOBIC) 15 MG tablet Take 1 tablet (15 mg total) by mouth daily. 12/05/22  Yes Crawford, Elizabeth A, MD  predniSONE (DELTASONE) 20 MG tablet Take 2 tablets (40 mg total) by mouth daily with breakfast. 02/07/23  Yes Crawford, Elizabeth A, MD  urea (CARMOL) 40 % CREA Apply 1 Application topically daily. 06/08/22  Yes Wagoner, Matthew R, DPM    Allergies: No Known Allergies  Social  History:  reports that he has been smoking cigarettes. He has been smoking an average of .3 packs per day. He has never used smokeless tobacco. He reports that he does not drink alcohol and does not use drugs.   Family History: Family History  Problem Relation Age of Onset   Cancer Mother    Cancer Father    Stroke Father    Colon cancer Neg Hx     Review of Systems: Review of Systems  Constitutional:  Negative for chills and fever.  HENT:  Negative for hearing loss.   Respiratory:  Negative for shortness of breath.   Cardiovascular:  Negative for chest pain.  Gastrointestinal:  Negative for abdominal pain, nausea and vomiting.  Genitourinary:  Negative for dysuria.  Musculoskeletal:  Negative for myalgias.  Skin:        Mass on his right back.  Neurological:  Negative for dizziness.  Psychiatric/Behavioral:  Negative for depression.     Physical Exam BP (!) 147/103   Pulse 94   Temp 98.9 F (37.2 C) (Oral)   Ht 6' 2" (1.88 m)   Wt 254 lb (115.2 kg)   SpO2 98%   BMI 32.61 kg/m  CONSTITUTIONAL: No acute distress, well nourished. HEENT:  Normocephalic, atraumatic, extraocular motion intact. NECK: Trachea is midline, and there is no jugular venous distension.  RESPIRATORY:  Lungs are clear, and breath sounds are equal bilaterally. Normal   respiratory effort without pathologic use of accessory muscles. CARDIOVASCULAR: Heart is regular without murmurs, gallops, or rubs. MUSCULOSKELETAL:  Normal muscle strength and tone in all four extremities.  No peripheral edema or cyanosis. SKIN: The patient has a 4 cm mass around mid back, just to the right of the spinal column.  It is mobile, soft, non-tender.  No overlying skin changes or drainage.  NEUROLOGIC:  Motor and sensation is grossly normal.  Cranial nerves are grossly intact. PSYCH:  Alert and oriented to person, place and time. Affect is normal.  Laboratory Analysis: Labs from 01/21/23: Na 139, K 3.9, Cl 108, CO2 24, BUN 16,  Cr 0.82.  WBC 7.8, Hgb 14.7, Hct 46, Plt 282  Imaging: No results found.  Assessment and Plan: This is a 62 y.o. male with a mid back lipoma.  --Discussed with the patient that the mass he has is consistent with a lipoma.  It is slowly growing, is soft, somewhat mobile.  Discussed with him the options for excision in the office vs in the OR and he has opted for doing this in the OR where he can be under anesthesia and not be aware of what we're doing.   --Discussed with him then the surgical plan for excision of his back lipoma.  Reviewed the surgery at length with him including the planned incision, the risks of bleeding, infection, injury to surrounding structures, that this would be an outpatient surgery, post-operative pain control, and he's willing to proceed.   --Will schedule him for surgery on 03/13/23.  All of his questions have been answered.  I spent 40 minutes dedicated to the care of this patient on the date of this encounter to include pre-visit review of records, face-to-face time with the patient discussing diagnosis and management, and any post-visit coordination of care.   Zaira Iacovelli Luis Denim Start, MD [facility]    

## 2023-02-27 NOTE — Telephone Encounter (Signed)
Patient has been advised of Pre-Admission date/time, and Surgery date at [facility]  Surgery Date: 03/13/23 Preadmission Testing Date: 03/05/23 (phone 8a-1p)  Patient has been made aware to call [phone], between 1-3:00pm the day before surgery, to find out what time to arrive for surgery.

## 2023-03-04 ENCOUNTER — Ambulatory Visit (INDEPENDENT_AMBULATORY_CARE_PROVIDER_SITE_OTHER): Payer: Commercial Managed Care - HMO | Admitting: Podiatry

## 2023-03-04 DIAGNOSIS — Z91199 Patient's noncompliance with other medical treatment and regimen due to unspecified reason: Secondary | ICD-10-CM

## 2023-03-05 ENCOUNTER — Encounter
Admission: RE | Admit: 2023-03-05 | Discharge: 2023-03-05 | Disposition: A | Discharge status: Home / Self Care | Payer: Commercial Managed Care - HMO | Source: Ambulatory Visit | Attending: Surgery | Admitting: Surgery

## 2023-03-05 VITALS — Ht 74.0 in | Wt 260.0 lb

## 2023-03-05 DIAGNOSIS — Z01812 Encounter for preprocedural laboratory examination: Secondary | ICD-10-CM

## 2023-03-05 DIAGNOSIS — I10 Essential (primary) hypertension: Secondary | ICD-10-CM

## 2023-03-05 HISTORY — DX: Prediabetes: R73.03

## 2023-03-05 NOTE — Patient Instructions (Signed)
Your procedure is scheduled on: Wednesday, March 20 Report to the Registration Desk on the 1st floor of the [facility]. To find out your arrival time, please call [phone] between 1PM - 3PM on: Tuesday, March 19 If your arrival time is 6:00 am, do not arrive before that time as the [facility] entrance doors do not open until 6:00 am.  REMEMBER: Instructions that are not followed completely may result in serious medical risk, up to and including death; or upon the discretion of your surgeon and anesthesiologist your surgery may need to be rescheduled.  Do not eat food after midnight the night before surgery.  No gum chewing or hard candies.  You may however, drink CLEAR liquids up to 2 hours before you are scheduled to arrive for your surgery. Do not drink anything within 2 hours of your scheduled arrival time.  Clear liquids include: - water  - apple juice without pulp - gatorade (not RED colors) - black coffee or tea (Do NOT add milk or creamers to the coffee or tea) Do NOT drink anything that is not on this list.  Continue taking all prescribed medications  TAKE ONLY THESE MEDICATIONS THE MORNING OF SURGERY WITH A SIP OF WATER:  Amlodipine Gabapentin  No Alcohol for 24 hours before or after surgery.  No Smoking including e-cigarettes for 24 hours before surgery.  No chewable tobacco products for at least 6 hours before surgery.  No nicotine patches on the day of surgery.  Do not use any "recreational" drugs for at least a week (preferably 2 weeks) before your surgery.  Please be advised that the combination of cocaine and anesthesia may have negative outcomes, up to and including death. If you test positive for cocaine, your surgery will be cancelled.  On the morning of surgery brush your teeth with toothpaste and water, you may rinse your mouth with mouthwash if you wish. Do not swallow any toothpaste or mouthwash.  Use CHG Soap as directed on instruction  sheet.  Do not wear jewelry, make-up, hairpins, clips or nail polish.  Do not wear lotions, powders, or perfumes.   Do not shave body hair from the neck down 48 hours before surgery.  Contact lenses, hearing aids and dentures may not be worn into surgery.  Do not bring valuables to the hospital. [facility] is not responsible for any missing/lost belongings or valuables.   Notify your doctor if there is any change in your medical condition (cold, fever, infection).  Wear comfortable clothing (specific to your surgery type) to the hospital.  After surgery, you can help prevent lung complications by doing breathing exercises.  Take deep breaths and cough every 1-2 hours. Your doctor may order a device called an Incentive Spirometer to help you take deep breaths.  If you are being discharged the day of surgery, you will not be allowed to drive home. You will need a responsible individual to drive you home and stay with you for 24 hours after surgery.   If you are taking public transportation, you will need to have a responsible individual with you.  Please call the [facility] at [phone] if you have any questions about these instructions.  Surgery Visitation Policy:  Patients undergoing a surgery or procedure may have two family members or support persons with them as long as the person is not COVID-19 positive or experiencing its symptoms.      Preparing for Surgery with CHLORHEXIDINE GLUCONATE (CHG) Soap  Chlorhexidine  Gluconate (CHG) Soap  o An antiseptic cleaner that kills germs and bonds with the skin to continue killing germs even after washing  o Used for showering the night before surgery and morning of surgery  Before surgery, you can play an important role by reducing the number of germs on your skin.  CHG (Chlorhexidine gluconate) soap is an antiseptic cleanser which kills germs and bonds with the skin to continue killing germs even after  washing.  Please do not use if you have an allergy to CHG or antibacterial soaps. If your skin becomes reddened/irritated stop using the CHG.  1. Shower the NIGHT BEFORE SURGERY and the MORNING OF SURGERY with CHG soap.  2. If you choose to wash your hair, wash your hair first as usual with your normal shampoo.  3. After shampooing, rinse your hair and body thoroughly to remove the shampoo.  4. Use CHG as you would any other liquid soap. You can apply CHG directly to the skin and wash gently with a scrungie or a clean washcloth.  5. Apply the CHG soap to your body only from the neck down. Do not use on open wounds or open sores. Avoid contact with your eyes, ears, mouth, and genitals (private parts). Wash face and genitals (private parts) with your normal soap.  6. Wash thoroughly, paying special attention to the area where your surgery will be performed.  7. Thoroughly rinse your body with warm water.  8. Do not shower/wash with your normal soap after using and rinsing off the CHG soap.  9. Pat yourself dry with a clean towel.  10. Wear clean pajamas to bed the night before surgery.  12. Place clean sheets on your bed the night of your first shower and do not sleep with pets.  13. Shower again with the CHG soap on the day of surgery prior to arriving at the hospital.  14. Do not apply any deodorants/lotions/powders.  15. Please wear clean clothes to the hospital.

## 2023-03-07 NOTE — Progress Notes (Signed)
No show last 2 appointments

## 2023-03-08 ENCOUNTER — Encounter
Admission: RE | Admit: 2023-03-08 | Discharge: 2023-03-08 | Disposition: A | Discharge status: Home / Self Care | Payer: Commercial Managed Care - HMO | Source: Ambulatory Visit | Attending: Surgery | Admitting: Surgery

## 2023-03-08 DIAGNOSIS — I10 Essential (primary) hypertension: Secondary | ICD-10-CM

## 2023-03-08 DIAGNOSIS — Z01812 Encounter for preprocedural laboratory examination: Secondary | ICD-10-CM | POA: Diagnosis not present

## 2023-03-08 LAB — BASIC METABOLIC PANEL
Anion gap: 12 (ref 5–15)
BUN: 21 mg/dL (ref 8–23)
CO2: 29 mmol/L (ref 22–32)
Calcium: 9.3 mg/dL (ref 8.9–10.3)
Chloride: 101 mmol/L (ref 98–111)
Creatinine, Ser: 0.75 mg/dL (ref 0.61–1.24)
GFR, Estimated: >60 mL/min (ref >60)
Glucose, Bld: 78 mg/dL (ref 70–99)
Potassium: 3.5 mmol/L (ref 3.5–5.1)
Sodium: 142 mmol/L (ref 135–145)

## 2023-03-10 IMAGING — DX DG FOOT COMPLETE 3+V*R*
3 series · 3 of 3 positions shown · non-contrast
Comparison: None.

CLINICAL DATA: Laceration plantar foot. Rule out foreign body or
fracture

EXAM:
RIGHT FOOT COMPLETE - 3+ VIEW

[foot ap]
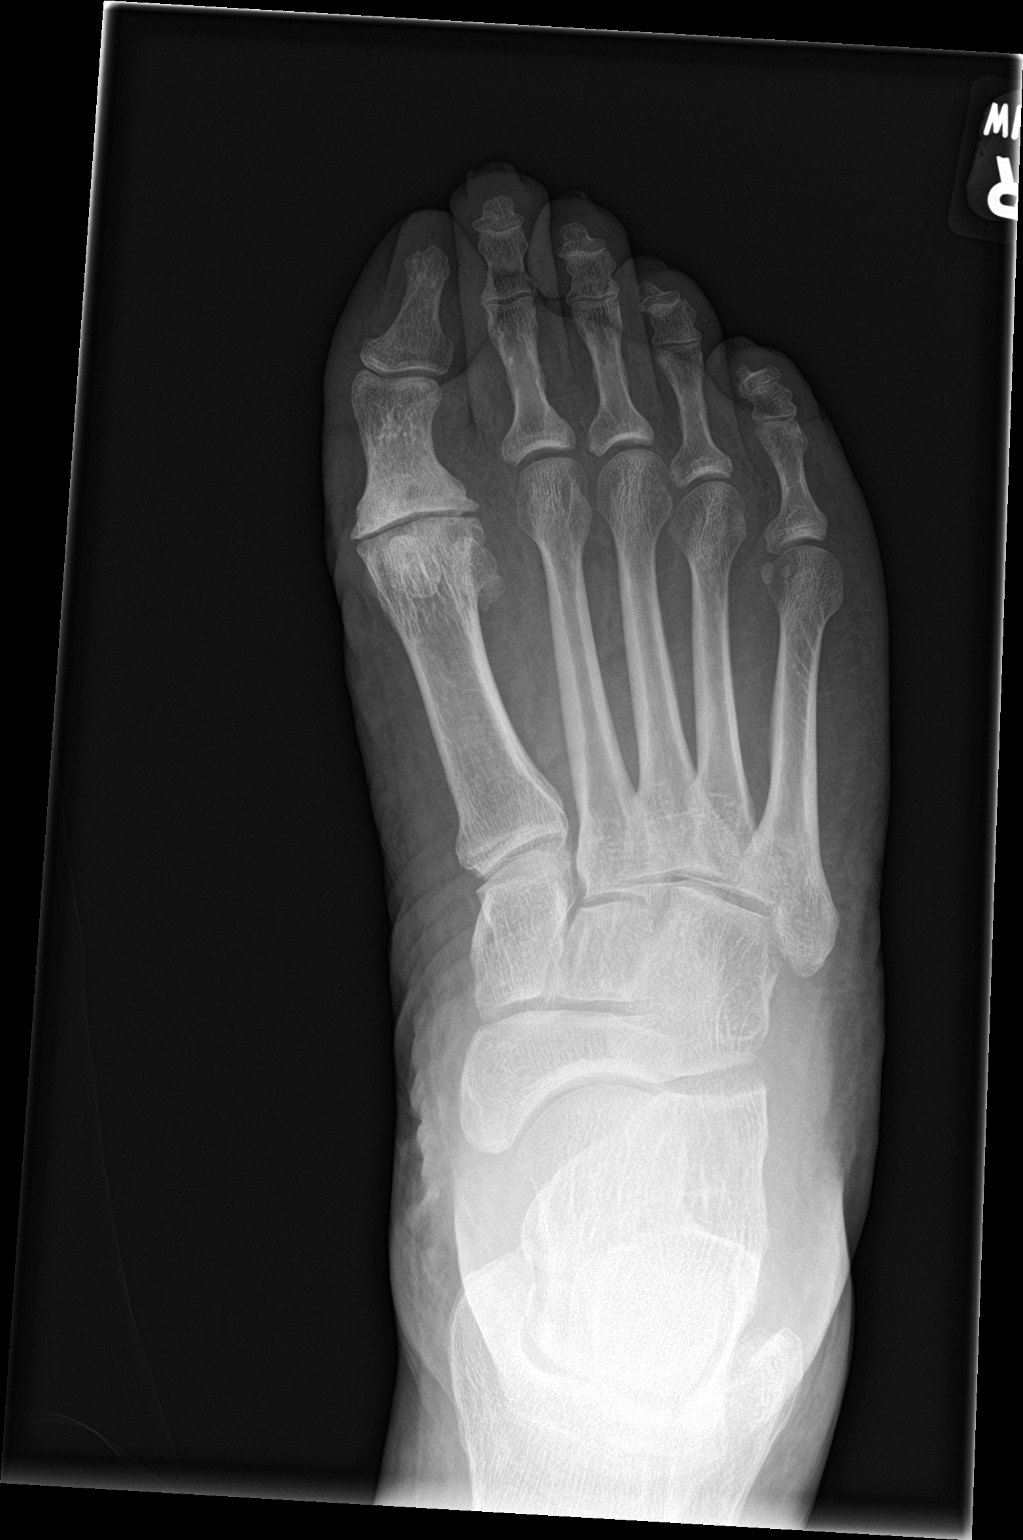

[foot obl]
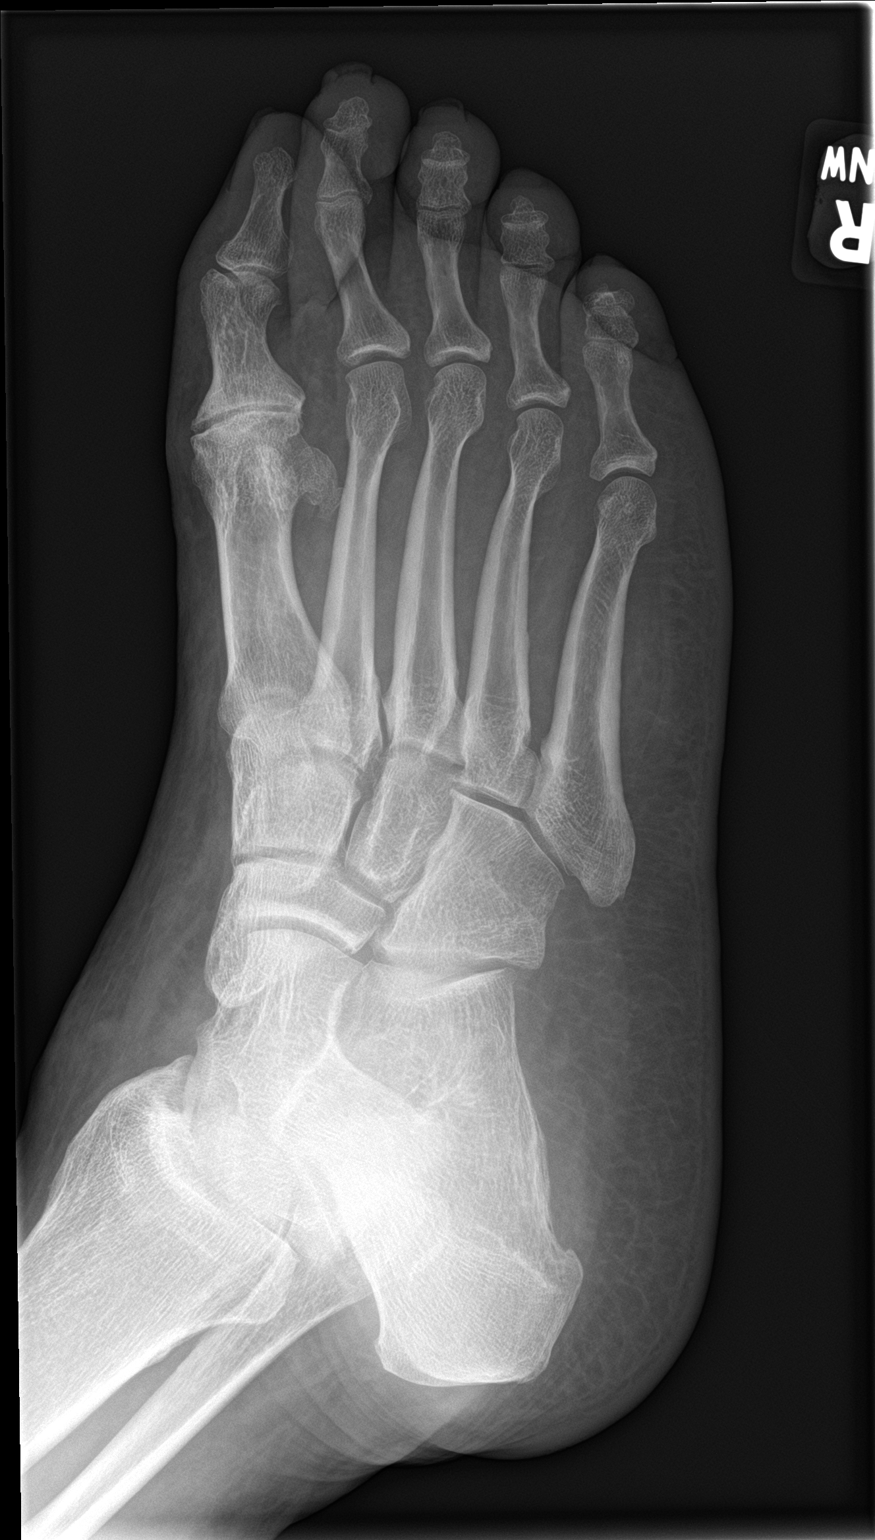

[foot lat]
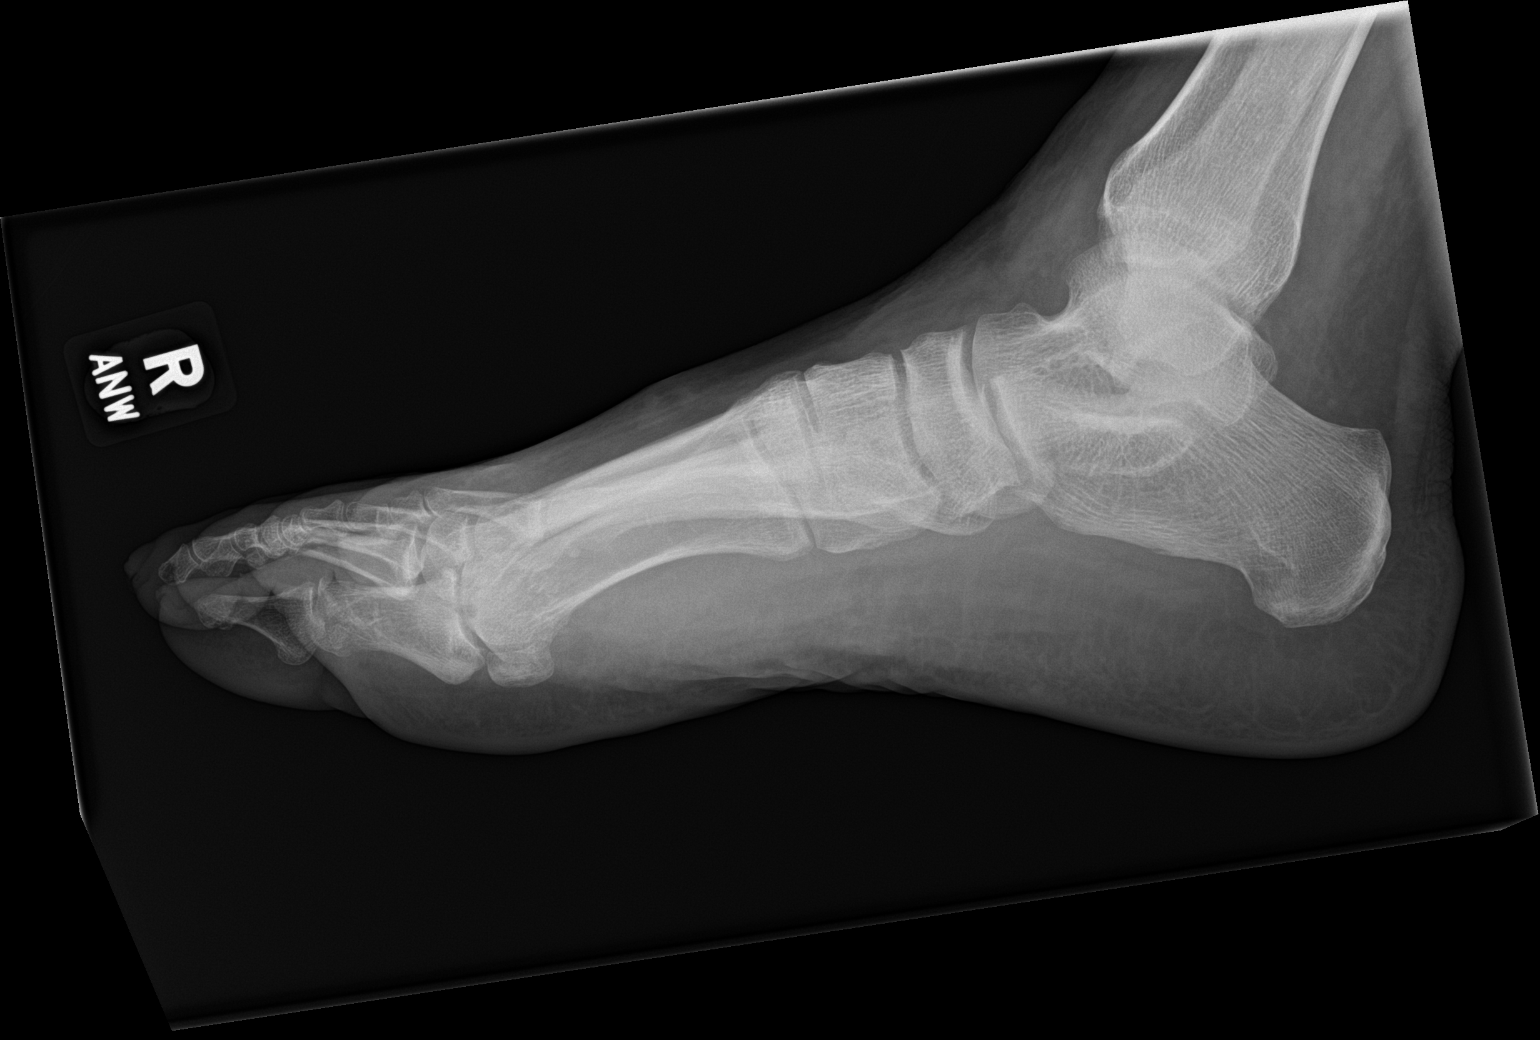

[3 of 3 positions shown; findings below may reference images not displayed]

FINDINGS: Negative for fracture.  No foreign body.

Advanced degenerative change in the first MTP joint with joint space
narrowing and spurring. No erosion.
IMPRESSION: Negative for acute fracture or arthropathy.

## 2023-03-13 ENCOUNTER — Ambulatory Visit
Admission: RE | Admit: 2023-03-13 | Discharge: 2023-03-13 | Disposition: A | Discharge status: Home / Self Care | Payer: Commercial Managed Care - HMO | Attending: Surgery | Admitting: Surgery

## 2023-03-13 ENCOUNTER — Ambulatory Visit: Payer: Commercial Managed Care - HMO | Admitting: Urgent Care

## 2023-03-13 ENCOUNTER — Other Ambulatory Visit: Payer: Self-pay

## 2023-03-13 ENCOUNTER — Encounter: Payer: Self-pay | Admitting: Surgery

## 2023-03-13 ENCOUNTER — Encounter
Admission: RE | Disposition: A | Discharge status: Home / Self Care | Payer: Self-pay | Source: Home / Self Care | Attending: Surgery

## 2023-03-13 ENCOUNTER — Ambulatory Visit: Payer: Commercial Managed Care - HMO | Admitting: Registered Nurse

## 2023-03-13 DIAGNOSIS — I10 Essential (primary) hypertension: Secondary | ICD-10-CM | POA: Insufficient documentation

## 2023-03-13 DIAGNOSIS — Z6833 Body mass index (BMI) 33.0-33.9, adult: Secondary | ICD-10-CM | POA: Diagnosis not present

## 2023-03-13 DIAGNOSIS — D171 Benign lipomatous neoplasm of skin and subcutaneous tissue of trunk: Secondary | ICD-10-CM | POA: Diagnosis not present

## 2023-03-13 DIAGNOSIS — R7303 Prediabetes: Secondary | ICD-10-CM | POA: Diagnosis not present

## 2023-03-13 DIAGNOSIS — E669 Obesity, unspecified: Secondary | ICD-10-CM | POA: Diagnosis not present

## 2023-03-13 DIAGNOSIS — F17210 Nicotine dependence, cigarettes, uncomplicated: Secondary | ICD-10-CM | POA: Diagnosis not present

## 2023-03-13 DIAGNOSIS — Z79899 Other long term (current) drug therapy: Secondary | ICD-10-CM | POA: Diagnosis not present

## 2023-03-13 HISTORY — PX: LIPOMA EXCISION: SHX5283

## 2023-03-13 SURGERY — EXCISION LIPOMA
Anesthesia: General | Site: Back

## 2023-03-13 MED ORDER — BUPIVACAINE LIPOSOME 1.3 % IJ SUSP
INTRAMUSCULAR | Status: DC | PRN
  Administered 2023-03-13: 40 mL via INTRAMUSCULAR

## 2023-03-13 MED ORDER — ACETAMINOPHEN 500 MG PO TABS
ORAL_TABLET | ORAL | Status: AC
  Administered 2023-03-13: 1000 mg via ORAL
  Filled 2023-03-13: qty 2

## 2023-03-13 MED ORDER — ACETAMINOPHEN 500 MG PO TABS: 1000.0000 mg | ORAL_TABLET | Freq: Four times a day (QID) | ORAL | Status: DC | PRN

## 2023-03-13 MED ORDER — BUPIVACAINE LIPOSOME 1.3 % IJ SUSP: 20.0000 mL | Freq: Once | INTRAMUSCULAR | Status: DC

## 2023-03-13 MED ORDER — OXYCODONE HCL 5 MG PO TABS: 5.0000 mg | ORAL_TABLET | Freq: Four times a day (QID) | ORAL | 0 refills | Status: DC | PRN

## 2023-03-13 MED ORDER — CHLORHEXIDINE GLUCONATE 0.12 % MT SOLN: 15.0000 mL | Freq: Once | OROMUCOSAL | Status: AC

## 2023-03-13 MED ORDER — ONDANSETRON HCL 4 MG/2ML IJ SOLN
INTRAMUSCULAR | Status: DC | PRN
  Administered 2023-03-13: 4 mg via INTRAVENOUS

## 2023-03-13 MED ORDER — GABAPENTIN 300 MG PO CAPS: 300.0000 mg | ORAL_CAPSULE | ORAL | Status: AC

## 2023-03-13 MED ORDER — CEFAZOLIN SODIUM-DEXTROSE 2-4 GM/100ML-% IV SOLN
2.0000 g | INTRAVENOUS | Status: AC
  Administered 2023-03-13: 2 g via INTRAVENOUS

## 2023-03-13 MED ORDER — LACTATED RINGERS IV SOLN: INTRAVENOUS | Status: DC

## 2023-03-13 MED ORDER — FAMOTIDINE 20 MG PO TABS: 20.0000 mg | ORAL_TABLET | Freq: Once | ORAL | Status: AC

## 2023-03-13 MED ORDER — BUPIVACAINE LIPOSOME 1.3 % IJ SUSP
INTRAMUSCULAR | Status: AC
  Filled 2023-03-13: qty 10

## 2023-03-13 MED ORDER — PROPOFOL 10 MG/ML IV BOLUS
INTRAVENOUS | Status: DC | PRN
  Administered 2023-03-13: 200 mg via INTRAVENOUS

## 2023-03-13 MED ORDER — ONDANSETRON HCL 4 MG/2ML IJ SOLN
INTRAMUSCULAR | Status: AC
  Filled 2023-03-13: qty 2

## 2023-03-13 MED ORDER — PROPOFOL 10 MG/ML IV BOLUS
INTRAVENOUS | Status: AC
  Filled 2023-03-13: qty 20

## 2023-03-13 MED ORDER — CHLORHEXIDINE GLUCONATE 0.12 % MT SOLN
OROMUCOSAL | Status: AC
  Administered 2023-03-13: 15 mL via OROMUCOSAL
  Filled 2023-03-13: qty 15

## 2023-03-13 MED ORDER — CHLORHEXIDINE GLUCONATE CLOTH 2 % EX PADS: 6.0000 | MEDICATED_PAD | Freq: Once | CUTANEOUS | Status: DC

## 2023-03-13 MED ORDER — LIDOCAINE HCL (CARDIAC) PF 100 MG/5ML IV SOSY
PREFILLED_SYRINGE | INTRAVENOUS | Status: DC | PRN
  Administered 2023-03-13: 100 mg via INTRAVENOUS

## 2023-03-13 MED ORDER — BUPIVACAINE LIPOSOME 1.3 % IJ SUSP
INTRAMUSCULAR | Status: AC
  Filled 2023-03-13: qty 20

## 2023-03-13 MED ORDER — FAMOTIDINE 20 MG PO TABS
ORAL_TABLET | ORAL | Status: AC
  Administered 2023-03-13: 20 mg via ORAL
  Filled 2023-03-13: qty 1

## 2023-03-13 MED ORDER — FENTANYL CITRATE (PF) 100 MCG/2ML IJ SOLN
INTRAMUSCULAR | Status: DC | PRN
  Administered 2023-03-13 (×2): 50 ug via INTRAVENOUS

## 2023-03-13 MED ORDER — IBUPROFEN 600 MG PO TABS: 600.0000 mg | ORAL_TABLET | Freq: Three times a day (TID) | ORAL | 1 refills | Status: AC | PRN

## 2023-03-13 MED ORDER — GABAPENTIN 300 MG PO CAPS
ORAL_CAPSULE | ORAL | Status: AC
  Administered 2023-03-13: 300 mg via ORAL
  Filled 2023-03-13: qty 1

## 2023-03-13 MED ORDER — ACETAMINOPHEN 500 MG PO TABS: 1000.0000 mg | ORAL_TABLET | ORAL | Status: AC

## 2023-03-13 MED ORDER — 0.9 % SODIUM CHLORIDE (POUR BTL) OPTIME
TOPICAL | Status: DC | PRN
  Administered 2023-03-13: 500 mL

## 2023-03-13 MED ORDER — ROCURONIUM BROMIDE 100 MG/10ML IV SOLN
INTRAVENOUS | Status: DC | PRN
  Administered 2023-03-13: 50 mg via INTRAVENOUS

## 2023-03-13 MED ORDER — CEFAZOLIN SODIUM-DEXTROSE 2-4 GM/100ML-% IV SOLN
INTRAVENOUS | Status: AC
  Filled 2023-03-13: qty 100

## 2023-03-13 MED ORDER — FENTANYL CITRATE (PF) 100 MCG/2ML IJ SOLN
INTRAMUSCULAR | Status: AC
  Filled 2023-03-13: qty 2

## 2023-03-13 MED ORDER — SUGAMMADEX SODIUM 200 MG/2ML IV SOLN
INTRAVENOUS | Status: DC | PRN
  Administered 2023-03-13: 200 mg via INTRAVENOUS

## 2023-03-13 MED ORDER — PHENYLEPHRINE 80 MCG/ML (10ML) SYRINGE FOR IV PUSH (FOR BLOOD PRESSURE SUPPORT)
PREFILLED_SYRINGE | INTRAVENOUS | Status: DC | PRN
  Administered 2023-03-13: 80 ug via INTRAVENOUS

## 2023-03-13 MED ORDER — KETOROLAC TROMETHAMINE 15 MG/ML IJ SOLN
INTRAMUSCULAR | Status: DC | PRN
  Administered 2023-03-13: 15 mg via INTRAVENOUS

## 2023-03-13 MED ORDER — BUPIVACAINE HCL (PF) 0.5 % IJ SOLN
INTRAMUSCULAR | Status: AC
  Filled 2023-03-13: qty 30

## 2023-03-13 MED ORDER — ORAL CARE MOUTH RINSE: 15.0000 mL | Freq: Once | OROMUCOSAL | Status: AC

## 2023-03-13 MED ORDER — MIDAZOLAM HCL 2 MG/2ML IJ SOLN
INTRAMUSCULAR | Status: AC
  Filled 2023-03-13: qty 2

## 2023-03-13 SURGICAL SUPPLY — 34 items
ADH SKN CLS APL DERMABOND .7 (GAUZE/BANDAGES/DRESSINGS) ×1
APL PRP STRL LF DISP 70% ISPRP (MISCELLANEOUS) ×1
CHLORAPREP W/TINT 26 (MISCELLANEOUS) ×1 IMPLANT
DERMABOND ADVANCED .7 DNX12 (GAUZE/BANDAGES/DRESSINGS) ×1 IMPLANT
DRAPE 3/4 80X56 (DRAPES) ×2 IMPLANT
DRAPE LAPAROTOMY 100X77 ABD (DRAPES) ×1 IMPLANT
ELECT CAUTERY BLADE TIP 2.5 (TIP) ×1
ELECT REM PT RETURN 9FT ADLT (ELECTROSURGICAL) ×1
ELECTRODE CAUTERY BLDE TIP 2.5 (TIP) ×1 IMPLANT
ELECTRODE REM PT RTRN 9FT ADLT (ELECTROSURGICAL) ×1 IMPLANT
GAUZE 4X4 16PLY ~~LOC~~+RFID DBL (SPONGE) ×1 IMPLANT
GLOVE SURG SYN 7.0 (GLOVE) ×1 IMPLANT
GLOVE SURG SYN 7.0 PF PI (GLOVE) ×1 IMPLANT
GLOVE SURG SYN 7.5  E (GLOVE) ×1
GLOVE SURG SYN 7.5 E (GLOVE) ×1 IMPLANT
GLOVE SURG SYN 7.5 PF PI (GLOVE) ×1 IMPLANT
GOWN STRL REUS W/ TWL LRG LVL3 (GOWN DISPOSABLE) ×2 IMPLANT
GOWN STRL REUS W/TWL LRG LVL3 (GOWN DISPOSABLE) ×2
KIT TURNOVER KIT A (KITS) ×1 IMPLANT
LABEL OR SOLS (LABEL) ×1 IMPLANT
MANIFOLD NEPTUNE II (INSTRUMENTS) ×1 IMPLANT
NDL HYPO 22X1.5 SAFETY MO (MISCELLANEOUS) ×1 IMPLANT
NEEDLE HYPO 22X1.5 SAFETY MO (MISCELLANEOUS) ×1 IMPLANT
PACK BASIN MINOR ARMC (MISCELLANEOUS) ×1 IMPLANT
SUT MNCRL 4-0 (SUTURE) ×1
SUT MNCRL 4-0 27XMFL (SUTURE) ×1
SUT VIC AB 0 SH 27 (SUTURE) ×2 IMPLANT
SUT VIC AB 2-0 SH 27 (SUTURE) ×1
SUT VIC AB 2-0 SH 27XBRD (SUTURE) IMPLANT
SUT VIC AB 3-0 SH 27 (SUTURE) ×1
SUT VIC AB 3-0 SH 27X BRD (SUTURE) ×2 IMPLANT
SUTURE MNCRL 4-0 27XMF (SUTURE) ×1 IMPLANT
SYR 30ML LL (SYRINGE) ×1 IMPLANT
TRAP FLUID SMOKE EVACUATOR (MISCELLANEOUS) ×1 IMPLANT

## 2023-03-13 NOTE — Anesthesia Procedure Notes (Signed)
Procedure Name: Intubation Date/Time: 03/13/2023 12:59 PM  Performed by: Natasha Mead, CRNAPre-anesthesia Checklist: Patient identified, Emergency Drugs available, Suction available and Patient being monitored Patient Re-evaluated:Patient Re-evaluated prior to induction Oxygen Delivery Method: Circle system utilized Preoxygenation: Pre-oxygenation with 100% oxygen Induction Type: IV induction Ventilation: Mask ventilation without difficulty Laryngoscope Size: McGraph and 4 Grade View: Grade I Tube type: Oral Tube size: 7.5 mm Number of attempts: 1 Airway Equipment and Method: Stylet and Oral airway Placement Confirmation: ETT inserted through vocal cords under direct vision, positive ETCO2 and breath sounds checked- equal and bilateral Secured at: 21 cm Tube secured with: Tape Dental Injury: Teeth and Oropharynx as per pre-operative assessment  Comments: Very poor lower dentition, no changes post-extubation

## 2023-03-13 NOTE — Anesthesia Preprocedure Evaluation (Addendum)
Anesthesia Evaluation  Patient identified by MRN, date of birth, ID band Patient awake    Reviewed: Allergy & Precautions, NPO status , Patient's Chart, lab work & pertinent test results  History of Anesthesia Complications Negative for: history of anesthetic complications  Airway Mallampati: IV   Neck ROM: Full    Dental  (+) Edentulous Upper, Poor Dentition   Pulmonary Current Smoker (1 ppd) and Patient abstained from smoking.   Pulmonary exam normal breath sounds clear to auscultation       Cardiovascular hypertension, Normal cardiovascular exam Rhythm:Regular Rate:Normal  ECG 01/22/23: normal   Neuro/Psych negative neurological ROS     GI/Hepatic negative GI ROS,,,  Endo/Other  Prediabetes; obesity  Renal/GU negative Renal ROS     Musculoskeletal   Abdominal   Peds  Hematology negative hematology ROS (+)   Anesthesia Other Findings   Reproductive/Obstetrics                             Anesthesia Physical Anesthesia Plan  ASA: 2  Anesthesia Plan: General   Post-op Pain Management:    Induction: Intravenous  PONV Risk Score and Plan: 1 and Ondansetron, Dexamethasone and Treatment may vary due to age or medical condition  Airway Management Planned: Oral ETT  Additional Equipment:   Intra-op Plan:   Post-operative Plan: Extubation in OR  Informed Consent: I have reviewed the patients History and Physical, chart, labs and discussed the procedure including the risks, benefits and alternatives for the proposed anesthesia with the patient or authorized representative who has indicated his/her understanding and acceptance.     Dental advisory given  Plan Discussed with: CRNA  Anesthesia Plan Comments: (Patient consented for risks of anesthesia including but not limited to:  - adverse reactions to medications - damage to eyes, teeth, lips or other oral mucosa - nerve damage  due to positioning  - sore throat or hoarseness - damage to heart, brain, nerves, lungs, other parts of body or loss of life  Informed patient about role of CRNA in peri- and intra-operative care.  Patient voiced understanding.)        Anesthesia Quick Evaluation

## 2023-03-13 NOTE — Progress Notes (Signed)
H/O htn. Was hypertensive preop (156/103) and states he took his Norvasc this morning. Remained hypertensive throughout case. Dr Erenest Rasher notified with no orders to tx at this time unless diastolic pressure rises above baseline.

## 2023-03-13 NOTE — Discharge Instructions (Addendum)
Discharge Instructions: 1.  Patient may shower, but do not scrub wounds heavily and dab dry only. 2.  Do not submerge wounds in pool/tub until fully healed. 3.  Do not apply ointments or hydrogen peroxide to the wounds. 4.  May apply ice packs to the wounds for comfort. 5.  Do not drive while taking narcotics for pain control.  Prior to driving, make sure you are able to rotate right and left to look at blindspots without significant pain or discomfort. 6.  Avoid strenuous activity or twisting/stretching motion with your back for two weeks to avoid extra tension on your incision.  AMBULATORY SURGERY  DISCHARGE INSTRUCTIONS   The drugs that you were given will stay in your system until tomorrow so for the next 24 hours you should not:  Drive an automobile Make any legal decisions Drink any alcoholic beverage   You may resume regular meals tomorrow.  Today it is better to start with liquids and gradually work up to solid foods.  You may eat anything you prefer, but it is better to start with liquids, then soup and crackers, and gradually work up to solid foods.   Please notify your doctor immediately if you have any unusual bleeding, trouble breathing, redness and pain at the surgery site, drainage, fever, or pain not relieved by medication.    Additional Instructions:   Please contact your physician with any problems or Same Day Surgery at [phone], Monday through Friday 6 am to 4 pm, or  at [facility] number at [phone].

## 2023-03-13 NOTE — Interval H&P Note (Signed)
History and Physical Interval Note:  03/13/2023 12:32 PM  Devin [name]  has presented today for surgery, with the diagnosis of lipoma of mid back 4 cm.  The various methods of treatment have been discussed with the patient and family. After consideration of risks, benefits and other options for treatment, the patient has consented to  Procedure(s): EXCISION LIPOMA, mid back (N/A) as a surgical intervention.  The patient's history has been reviewed, patient examined, no change in status, stable for surgery.  I have reviewed the patient's chart and labs.  Questions were answered to the patient's satisfaction.     Shriya Aker

## 2023-03-13 NOTE — Op Note (Signed)
  Procedure Date:  03/13/2023  Pre-operative Diagnosis:  Right mid back lipoma  Post-operative Diagnosis: Right mid back lipoma, 5 cm  Procedure:  Excision of right mid back lipoma  Surgeon:  Melvyn Neth, MD  Anesthesia:  General endotracheal  Estimated Blood Loss:  10 ml  Specimens:  right back lipoma  Complications:  None  Indications for Procedure:  This is a 62 y.o. male with diagnosis of a symptomatic right mid back lipoma.  The patient wishes to have this excised under anesthesia. The risks of bleeding, abscess or infection, injury to surrounding structures, and need for further procedures were all discussed with the patient and he was willing to proceed.  Description of Procedure: The patient was correctly identified in the preoperative area and brought into the operating room.  The patient was placed supine with VTE prophylaxis in place.  Appropriate time-outs were performed.  Anesthesia was induced and the patient was intubated.  Appropriate antibiotics were infused.  The patient's back was prepped and draped in usual sterile fashion.  A 5 cm incision was made over the lipoma, and cautery was used to dissect down the subcutaneous tissue to the lipoma itself.  Skin flaps were created using cautery as well, and then the lipoma was excised using cautery, intact.  It was sent off to pathology.  The cavity was then irrigated and hemostasis was assured with cautery.  Local anesthetic was infused intradermally.  The skin flaps were then approximated to the wound bed using multiple 2-0 Vicryl sutures in order to decrease the amount of dead space in the wound.  The wound edges were then closed in three layers using 2-0 Vicryl, 3-0 Vicryl and 4-0 Monocryl.  The incision was cleaned and sealed with DermaBond.  The patient was then emerged from anesthesia, extubated, and brought to the recovery room for further management.    The patient tolerated the procedure well and all counts were  correct at the end of the case.   Melvyn Neth, MD

## 2023-03-13 NOTE — Transfer of Care (Signed)
Immediate Anesthesia Transfer of Care Note  Patient: Devin [name]  Procedure(s) Performed: EXCISION LIPOMA, mid back (Back)  Patient Location: PACU  Anesthesia Type:General  Level of Consciousness: awake, alert , and oriented  Airway & Oxygen Therapy: Patient Spontanous Breathing and Patient connected to face mask oxygen  Post-op Assessment: Report given to RN and Post -op Vital signs reviewed and stable  Post vital signs: stable  Last Vitals:  Vitals Value Taken Time  BP 156/103 03/13/23 1404  Temp    Pulse 87 03/13/23 1408  Resp    SpO2 100 % 03/13/23 1408  Vitals shown include unvalidated device data.  Last Pain: There were no vitals filed for this visit.       Complications: No notable events documented.

## 2023-03-13 NOTE — Anesthesia Postprocedure Evaluation (Signed)
Anesthesia Post Note  Patient: Devin [name]  Procedure(s) Performed: EXCISION LIPOMA, mid back (Back)  Patient location during evaluation: PACU Anesthesia Type: General Level of consciousness: awake and alert, oriented and patient cooperative Pain management: pain level controlled Vital Signs Assessment: post-procedure vital signs reviewed and stable Respiratory status: spontaneous breathing, nonlabored ventilation and respiratory function stable Cardiovascular status: blood pressure returned to baseline and stable Postop Assessment: adequate PO intake Anesthetic complications: no   No notable events documented.   Last Vitals:  Vitals:   03/13/23 1404 03/13/23 1415  BP: (!) 156/103 (!) 151/106  Pulse: 81 81  Resp: 19 (!) 21  Temp: 36.5 C   SpO2: 99% 99%    Last Pain:  Vitals:   03/13/23 1415  PainSc: 0-No pain                 Darrin Nipper

## 2023-03-14 ENCOUNTER — Encounter: Payer: Self-pay | Admitting: Surgery

## 2023-03-15 LAB — SURGICAL PATHOLOGY

## 2023-03-28 ENCOUNTER — Other Ambulatory Visit: Payer: Self-pay

## 2023-03-28 ENCOUNTER — Encounter: Payer: Self-pay | Admitting: Physician Assistant

## 2023-03-28 ENCOUNTER — Ambulatory Visit (INDEPENDENT_AMBULATORY_CARE_PROVIDER_SITE_OTHER): Payer: Commercial Managed Care - HMO | Admitting: Physician Assistant

## 2023-03-28 VITALS — BP 124/85 | HR 99 | Temp 97.8°F | Ht 74.0 in | Wt 254.0 lb

## 2023-03-28 DIAGNOSIS — D171 Benign lipomatous neoplasm of skin and subcutaneous tissue of trunk: Secondary | ICD-10-CM

## 2023-03-28 DIAGNOSIS — Z09 Encounter for follow-up examination after completed treatment for conditions other than malignant neoplasm: Secondary | ICD-10-CM

## 2023-03-28 NOTE — Patient Instructions (Signed)
Please call with any questions or concerns.

## 2023-03-28 NOTE — Progress Notes (Signed)
[facility] POST-OP OFFICE VISIT  03/28/2023  HPI: Devin [name] is a 62 y.o. male 15 days s/p excision of right mid-back lipoma (5 cm) with Dr Kirke Corin  He has done well No complaints of pain No fever; chills Incision is healed well; no drainage No other complaints  Vital signs: BP 124/85   Pulse 99   Temp 97.8 F (36.6 C) (Oral)   Ht 6\' 2"  (1.88 m)   Wt 254 lb (115.2 kg)   SpO2 99%   BMI 32.61 kg/m    Physical Exam: Constitutional: Well appearing male, NAD Skin: 5 cm incision to the medial right mid-back; well healed; no erythema  Assessment/Plan: This is a 63 y.o. male 15 days s/p excision of right mid-back lipoma (5 cm) with Dr Kirke Corin   - Pain control prn  - Reviewed wound care recommendation  - Reviewed surgical pathology; Lipoma  - He can follow up on as needed basis; He understands to call with questions/concerns  -- Edison Simon, PA-C [facility] 03/28/2023, 2:47 PM M-F: 7am - 4pm

## 2023-06-19 ENCOUNTER — Encounter: Payer: Self-pay | Admitting: Internal Medicine

## 2023-06-19 ENCOUNTER — Ambulatory Visit (INDEPENDENT_AMBULATORY_CARE_PROVIDER_SITE_OTHER): Payer: Commercial Managed Care - HMO | Admitting: Internal Medicine

## 2023-06-19 ENCOUNTER — Ambulatory Visit (INDEPENDENT_AMBULATORY_CARE_PROVIDER_SITE_OTHER): Payer: Self-pay

## 2023-06-19 VITALS — BP 132/80 | HR 76 | Temp 98.3°F | Ht 74.0 in | Wt 256.0 lb

## 2023-06-19 DIAGNOSIS — N521 Erectile dysfunction due to diseases classified elsewhere: Secondary | ICD-10-CM

## 2023-06-19 DIAGNOSIS — M5416 Radiculopathy, lumbar region: Secondary | ICD-10-CM

## 2023-06-19 MED ORDER — BACLOFEN 10 MG PO TABS: 10.0000 mg | ORAL_TABLET | Freq: Three times a day (TID) | ORAL | 0 refills | Status: DC

## 2023-06-19 MED ORDER — SILDENAFIL CITRATE 100 MG PO TABS: 50.0000 mg | ORAL_TABLET | Freq: Every day | ORAL | 11 refills | Status: AC | PRN

## 2023-06-19 MED ORDER — TRAMADOL HCL 50 MG PO TABS: 50.0000 mg | ORAL_TABLET | Freq: Three times a day (TID) | ORAL | 0 refills | Status: DC | PRN

## 2023-06-19 NOTE — Progress Notes (Unsigned)
   Subjective:   Patient ID: NGOC DAUGHTRIDGE, male    DOB: 04/11/1961, 62 y.o.   MRN: [id number]  HPI The patient is a 62 YO man coming in for several concerns including ED issues, back pain ongoing (using otc without relief and tried the flexeril we prescribed and gabapentin without relief, overall worsening lately).   Review of Systems  Constitutional:  Positive for activity change. Negative for appetite change, fatigue, fever and unexpected weight change.  HENT: Negative.    Eyes: Negative.   Respiratory: Negative.  Negative for cough, chest tightness and shortness of breath.   Cardiovascular: Negative.  Negative for chest pain, palpitations and leg swelling.  Gastrointestinal:  Negative for abdominal distention, abdominal pain, constipation, diarrhea, nausea and vomiting.  Genitourinary:        ED  Musculoskeletal:  Positive for back pain and myalgias. Negative for arthralgias.  Skin: Negative.   Neurological: Negative.  Negative for syncope, weakness and numbness.  Psychiatric/Behavioral: Negative.      Objective:  Physical Exam Constitutional:      Appearance: He is well-developed.  HENT:     Head: Normocephalic and atraumatic.  Cardiovascular:     Rate and Rhythm: Normal rate and regular rhythm.  Pulmonary:     Effort: Pulmonary effort is normal. No respiratory distress.     Breath sounds: Normal breath sounds. No wheezing or rales.  Abdominal:     General: Bowel sounds are normal. There is no distension.     Palpations: Abdomen is soft.     Tenderness: There is no abdominal tenderness. There is no rebound.  Musculoskeletal:        General: Tenderness present.     Cervical back: Normal range of motion.  Skin:    General: Skin is warm and dry.  Neurological:     Mental Status: He is alert and oriented to person, place, and time.     Coordination: Coordination normal.     Vitals:   06/19/23 0820  BP: 132/80  Pulse: 76  Temp: 98.3 F (36.8 C)  TempSrc: Oral   SpO2: 98%  Weight: 256 lb (116.1 kg)  Height: 6\' 2"  (1.88 m)    Assessment & Plan:  Visit time 25 minutes in face to face communication with patient and coordination of care, additional 5 minutes spent in record review, coordination or care, ordering tests, communicating/referring to other healthcare professionals, documenting in medical records all on the same day of the visit for total time 30 minutes spent on the visit.

## 2023-06-19 NOTE — Patient Instructions (Signed)
We will try baclofen for pain for the back and get you in with a back specialist.   We have sent in viagra to help with the erection.

## 2023-06-20 NOTE — Assessment & Plan Note (Signed)
Rx viagra to help this. He is concerned about it and causing stress in his marriage.

## 2023-06-20 NOTE — Assessment & Plan Note (Signed)
Checking x-ray lumbar to assess for change and rx tramadol short term to try. Rx baclofen to try as well and referral to neurosurgery as this is chronic and progressive and he wishes to discuss other options for more long term pain relief. He has tried nsaids, tylenol, patches otc without relief. 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

## 2023-07-09 ENCOUNTER — Other Ambulatory Visit: Payer: Self-pay | Admitting: Internal Medicine

## 2023-07-31 ENCOUNTER — Other Ambulatory Visit (HOSPITAL_COMMUNITY): Payer: Self-pay

## 2023-08-01 ENCOUNTER — Other Ambulatory Visit (HOSPITAL_COMMUNITY): Payer: Self-pay

## 2023-08-01 ENCOUNTER — Telehealth: Payer: Self-pay | Admitting: Pharmacy Technician

## 2023-08-01 NOTE — Telephone Encounter (Signed)
disregard

## 2023-08-09 ENCOUNTER — Telehealth: Payer: Self-pay | Admitting: Internal Medicine

## 2023-08-09 NOTE — Telephone Encounter (Signed)
Patient called and said he is having more foot pain than usual because he is working overtime. He wanted to know if there was anything Dr. Okey Dupre would recommend to help. Patient would like a call back at [phone].

## 2023-08-09 NOTE — Telephone Encounter (Signed)
Please advise or patient

## 2023-08-13 NOTE — Telephone Encounter (Signed)
Informed patient about giving [facility] a call to see what they advise about his foot

## 2023-08-21 ENCOUNTER — Telehealth: Payer: Self-pay | Admitting: Internal Medicine

## 2023-08-21 ENCOUNTER — Other Ambulatory Visit (HOSPITAL_COMMUNITY): Payer: Self-pay

## 2023-08-21 ENCOUNTER — Encounter: Payer: Self-pay | Admitting: Internal Medicine

## 2023-08-21 ENCOUNTER — Ambulatory Visit: Payer: 59 | Admitting: Internal Medicine

## 2023-08-21 ENCOUNTER — Telehealth: Payer: Self-pay

## 2023-08-21 VITALS — BP 128/100 | HR 96 | Temp 98.2°F | Ht 74.0 in | Wt 247.0 lb

## 2023-08-21 DIAGNOSIS — Z23 Encounter for immunization: Secondary | ICD-10-CM | POA: Diagnosis not present

## 2023-08-21 DIAGNOSIS — M5416 Radiculopathy, lumbar region: Secondary | ICD-10-CM | POA: Diagnosis not present

## 2023-08-21 MED ORDER — BACLOFEN 10 MG PO TABS: 10.0000 mg | ORAL_TABLET | Freq: Three times a day (TID) | ORAL | 0 refills | Status: DC

## 2023-08-21 MED ORDER — TRAMADOL HCL 50 MG PO TABS: 50.0000 mg | ORAL_TABLET | Freq: Three times a day (TID) | ORAL | 2 refills | Status: DC | PRN

## 2023-08-21 MED ORDER — AMLODIPINE BESYLATE 10 MG PO TABS: 10.0000 mg | ORAL_TABLET | Freq: Every day | ORAL | 3 refills | Status: DC

## 2023-08-21 NOTE — Telephone Encounter (Signed)
Patient needs prior authorization started for traMADol (ULTRAM) 50 MG tablet. Best callback is [phone].

## 2023-08-21 NOTE — Progress Notes (Signed)
   Subjective:   Patient ID: Devin [name], male    DOB: [birth year], 62 y.o.   MRN: [id number]  HPI The patient is a 62 YO man coming in for pain in foot.   Review of Systems  Constitutional: Negative.   HENT: Negative.    Eyes: Negative.   Respiratory:  Negative for cough, chest tightness and shortness of breath.   Cardiovascular:  Negative for chest pain, palpitations and leg swelling.  Gastrointestinal:  Negative for abdominal distention, abdominal pain, constipation, diarrhea, nausea and vomiting.  Musculoskeletal:  Positive for arthralgias and back pain.  Skin: Negative.   Neurological: Negative.   Psychiatric/Behavioral: Negative.      Objective:  Physical Exam Constitutional:      Appearance: He is well-developed.  HENT:     Head: Normocephalic and atraumatic.  Cardiovascular:     Rate and Rhythm: Normal rate and regular rhythm.  Pulmonary:     Effort: Pulmonary effort is normal. No respiratory distress.     Breath sounds: Normal breath sounds. No wheezing or rales.  Abdominal:     General: Bowel sounds are normal. There is no distension.     Palpations: Abdomen is soft.     Tenderness: There is no abdominal tenderness. There is no rebound.  Musculoskeletal:     Cervical back: Normal range of motion.  Skin:    General: Skin is warm and dry.  Neurological:     Mental Status: He is alert and oriented to person, place, and time.     Coordination: Coordination normal.     Vitals:   08/21/23 1102 08/21/23 1107 08/21/23 1147  BP: (!) 128/100 (!) 128/100 (!) 128/100  Pulse: 96    Temp: 98.2 F (36.8 C)    TempSrc: Oral    SpO2: 97%    Weight: 247 lb (112 kg)    Height: 6\' 2"  (1.88 m)      Assessment & Plan:  Flu shot given at visit

## 2023-08-21 NOTE — Patient Instructions (Addendum)
We have sent in refill of tramadol and baclofen for pain.  We will increase amlodipine to 10 mg daily. Keep lisinopril/hydrochlorothiazide the same dose.   For the back: [facility] [address] [place] [address] [place] [address] [phone]

## 2023-08-21 NOTE — Telephone Encounter (Signed)
Pharmacy Patient Advocate Encounter   Received notification from Pt Calls Messages that prior authorization for Tramadol 50mg  is required/requested.   Insurance verification completed.   The patient is insured through [employer] [facility] .   Per test claim: PA required; PA started via CoverMyMeds. KEY [id number] . Waiting for clinical questions to populate.

## 2023-08-23 ENCOUNTER — Encounter: Payer: Self-pay | Admitting: Internal Medicine

## 2023-08-23 NOTE — Assessment & Plan Note (Addendum)
Given information referral was sent to so he can contact directly. Pain is worsening overall. Refilled tramadol and baclofen as they helped his pain.

## 2023-08-27 DIAGNOSIS — Z23 Encounter for immunization: Secondary | ICD-10-CM | POA: Diagnosis not present

## 2023-08-27 NOTE — Addendum Note (Signed)
Addended by: Levonne Lapping on: 08/27/2023 04:34 PM   Modules accepted: Orders

## 2023-08-28 ENCOUNTER — Other Ambulatory Visit (HOSPITAL_COMMUNITY): Payer: Self-pay

## 2023-08-28 NOTE — Telephone Encounter (Signed)
Pharmacy Patient Advocate Encounter  Received notification from [facility] that Prior Authorization for Tramadol 50mg  has been  resolved, no additional PA is required.    PA #/Case ID/Reference #: [id number]

## 2023-12-26 ENCOUNTER — Telehealth: Payer: Self-pay

## 2023-12-26 NOTE — Telephone Encounter (Signed)
 Copied from CRM [id number]. Topic: Clinical - Medication Refill >> Dec 26, 2023  2:34 PM Devin [name] wrote: Most Recent Primary Care Visit:  Provider: ROLLENE NORRIS A  Department: [facility]  Visit Type: OFFICE VISIT  Date: 08/21/2023  Medication: traMADol  (ULTRAM ) 50 MG tablet and amLODipine  (NORVASC ) 10 MG - He has an appointment scheduled for January 7 at 11:00 AM with Dr. Alvia - first available   Has the patient contacted their pharmacy? No (Agent: If no, request that the patient contact the pharmacy for the refill. If patient does not wish to contact the pharmacy document the reason why and proceed with request.) (Agent: If yes, when and what did the pharmacy advise?)  Is this the correct pharmacy for this prescription? Yes [facility] If no, delete pharmacy and type the correct one.  This is the patient's preferred pharmacy:  [facility] - [place] ([place] - [address] [place] [address] Phone: [phone] Fax: [phone]   Has the prescription been filled recently? No  Is the patient out of the medication? He has been out for several weeks due to insurance not paying   Has the patient been seen for an appointment in the last year OR does the patient have an upcoming appointment? Yes  Can we respond through MyChart? No   Agent: Please be advised that Rx refills may take up to 3 business days. We ask that you follow-up with your pharmacy.

## 2023-12-27 ENCOUNTER — Other Ambulatory Visit: Payer: Self-pay

## 2023-12-27 ENCOUNTER — Telehealth: Payer: Self-pay

## 2023-12-27 ENCOUNTER — Other Ambulatory Visit: Payer: Self-pay | Admitting: Internal Medicine

## 2023-12-27 DIAGNOSIS — I10 Essential (primary) hypertension: Secondary | ICD-10-CM

## 2023-12-27 MED ORDER — AMLODIPINE BESYLATE 10 MG PO TABS: 10.0000 mg | ORAL_TABLET | Freq: Every day | ORAL | 0 refills | Status: DC

## 2023-12-27 NOTE — Telephone Encounter (Signed)
 Sent in 30 day and also sent patient a my chart message as well

## 2023-12-27 NOTE — Telephone Encounter (Signed)
 Done

## 2023-12-27 NOTE — Telephone Encounter (Signed)
 Copied from CRM [id number]. Topic: Clinical - Medication Refill >> Dec 27, 2023  9:09 AM Leotis ORN wrote: Most Recent Primary Care Visit:  Provider: ROLLENE NORRIS A  Department: [facility]  Visit Type: OFFICE VISIT  Date: 08/21/2023  Medication:  amLODipine  (NORVASC ) 10 MG tablet  Has the patient contacted their pharmacy? Yes (Agent: If no, request that the patient contact the pharmacy for the refill. If patient does not wish to contact the pharmacy document the reason why and proceed with request.) (Agent: If yes, when and what did the pharmacy advise?)  Is this the correct pharmacy for this prescription? Yes If no, delete pharmacy and type the correct one.  This is the patient's preferred pharmacy:  [facility] - [place] ([place]), [place] - [address] [place] ([place]) [place] [address] Phone: [phone] Fax: [phone]   Has the prescription been filled recently? Yes  Is the patient out of the medication? Yes  Has the patient been seen for an appointment in the last year OR does the patient have an upcoming appointment? Yes  Can we respond through MyChart? Yes  Agent: Please be advised that Rx refills may take up to 3 business days. We ask that you follow-up with your pharmacy.

## 2023-12-27 NOTE — Telephone Encounter (Signed)
 Ok to amlodipine but no more than #30 no refills. He had severely high BP and did not follow up appropriately. No to tramadol without follow up.

## 2023-12-31 ENCOUNTER — Ambulatory Visit: Payer: 59 | Admitting: Family Medicine

## 2023-12-31 NOTE — Progress Notes (Deleted)
   Established Patient Office Visit  Subjective   Patient ID: Devin [name], male    DOB: December 23, 1961  Age: 63 y.o. MRN: [id number]  No chief complaint on file.   HPI   ROS Per HPI    Objective:     There were no vitals taken for this visit.  Physical Exam Vitals and nursing note reviewed.  Constitutional:      Appearance: Normal appearance.  HENT:     Head: Normocephalic and atraumatic.  Eyes:     Extraocular Movements: Extraocular movements intact.  Cardiovascular:     Rate and Rhythm: Normal rate and regular rhythm.     Pulses: Normal pulses.     Heart sounds: Normal heart sounds.  Pulmonary:     Effort: Pulmonary effort is normal.     Breath sounds: Normal breath sounds.  Musculoskeletal:        General: Normal range of motion.     Cervical back: Normal range of motion.  Skin:    General: Skin is warm and dry.  Neurological:     General: No focal deficit present.     Mental Status: He is alert and oriented to person, place, and time.  Psychiatric:        Mood and Affect: Mood normal.        Behavior: Behavior normal.    No results found for any visits on 12/31/23.   The 10-year ASCVD risk score (Arnett DK, et al., 2019) is: 26.1%    Assessment & Plan:   There are no diagnoses linked to this encounter.   No follow-ups on file.    Corean Ku, FNP

## 2024-02-03 ENCOUNTER — Other Ambulatory Visit: Payer: Self-pay | Admitting: Internal Medicine

## 2024-02-03 NOTE — Telephone Encounter (Signed)
 Copied from CRM [id number]. Topic: Clinical - Medication Refill >> Feb 03, 2024  3:45 PM [name] wrote: Most Recent Primary Care Visit:  Provider: Bambi Lever A  Department: [facility]  Visit Type: OFFICE VISIT  Date: 08/21/2023  Medication: traMADol  (ULTRAM ) 50 MG tablet   Has the patient contacted their pharmacy? No, doesn't have anymore refills  (Agent: If no, request that the patient contact the pharmacy for the refill. If patient does not wish to contact the pharmacy document the reason why and proceed with request.) (Agent: If yes, when and what did the pharmacy advise?)  Is this the correct pharmacy for this prescription? Yes If no, delete pharmacy and type the correct one.  This is the patient's preferred pharmacy:  [facility] - [place] ([place] - [address] [place] ([place]) [place] [address] Phone: [phone] Fax: [phone]   Has the prescription been filled recently? No  Is the patient out of the medication? No  Has the patient been seen for an appointment in the last year OR does the patient have an upcoming appointment? Yes  Can we respond through MyChart? No  Agent: Please be advised that Rx refills may take up to 3 business days. We ask that you follow-up with your pharmacy.

## 2024-02-04 NOTE — Telephone Encounter (Signed)
08/21/2023 last OV

## 2024-02-05 ENCOUNTER — Other Ambulatory Visit: Payer: Self-pay | Admitting: Internal Medicine

## 2024-02-11 ENCOUNTER — Encounter: Payer: 59 | Admitting: Internal Medicine

## 2024-03-12 ENCOUNTER — Other Ambulatory Visit: Payer: Self-pay | Admitting: Internal Medicine

## 2024-03-12 ENCOUNTER — Ambulatory Visit: Payer: Self-pay

## 2024-03-12 ENCOUNTER — Ambulatory Visit: Admitting: Internal Medicine

## 2024-03-12 DIAGNOSIS — I10 Essential (primary) hypertension: Secondary | ICD-10-CM

## 2024-03-12 NOTE — Telephone Encounter (Signed)
 Copied from CRM [id number]. Topic: Clinical - Medication Refill >> Mar 12, 2024  9:43 AM Drema Balzarine wrote: Most Recent Primary Care Visit:  Provider: Hillard Danker A  Department: [facility]  Visit Type: OFFICE VISIT  Date: 08/21/2023  Medication: amLODipine, traMADol  Has the patient contacted their pharmacy? Yes (Agent: If no, request that the patient contact the pharmacy for the refill. If patient does not wish to contact the pharmacy document the reason why and proceed with request.) (Agent: If yes, when and what did the pharmacy advise?)  Is this the correct pharmacy for this prescription? Yes If no, delete pharmacy and type the correct one.  This is the patient's preferred pharmacy:   [facility] - [place] ([place]), [place] - [address] [place] ([place]) [place] [address] Phone: [phone] Fax: [phone]   Has the prescription been filled recently? Yes  Is the patient out of the medication? Yes, for a few days   Has the patient been seen for an appointment in the last year OR does the patient have an upcoming appointment? Yes  Can we respond through MyChart? No  Agent: Please be advised that Rx refills may take up to 3 business days. We ask that you follow-up with your pharmacy.

## 2024-03-12 NOTE — Telephone Encounter (Signed)
  Chief Complaint: Back pain Symptoms: pain  Frequency: constant since 3am Pertinent Negatives: Patient denies numbness, tingling, bladder issues, all other symptoms.  Disposition: [] ED /[x] Urgent Care (no appt availability in office) / [] Appointment(In office/virtual)/ []  [facility]/ [] Home Care/ [] Refused Recommended Disposition /[]  Mobile Bus/ []  Follow-up with PCP Additional Notes:  Drives forklift for work, has been having back tenderness that was being treated with Tramadol, he has been out of this for one week but still working through the discomfort. Calling now because lower back pain became severe 8/10 around 3 am. He used Tylenol with minimal effect. Acute visit scheduled with another provider due to no acute visits available with PCP. Care advice discussed as documented in protocol. Discussed reasons to call back.   Copied from CRM [id number]. Topic: Clinical - Red Word Triage >> Mar 12, 2024  9:46 AM Drema Balzarine wrote: Red Word that prompted transfer to Nurse Triage: Patient having back pain 8/10, requested an appointment Reason for Disposition  [1] SEVERE back pain (e.g., excruciating, unable to do any normal activities) AND [2] not improved 2 hours after pain medicine  Protocols used: Back Pain-A-AH

## 2024-03-13 MED ORDER — AMLODIPINE BESYLATE 10 MG PO TABS: 10.0000 mg | ORAL_TABLET | Freq: Every day | ORAL | 0 refills | Status: DC

## 2024-03-30 ENCOUNTER — Ambulatory Visit: Payer: Self-pay

## 2024-03-30 NOTE — Telephone Encounter (Signed)
 Patient called in stating his bp has been running high and he has been out of his Amlodopine. Patient states the pharmacy stated they did not have it but this RN confirmed it was showing as sent to pharmacy on 3/21. This RN called pharmacy with patient conferenced in to phone call but the call lost service. This RN has attempted to call patient back 3 times but no answer, straight to voicemail. This RN has left voicemail. This RN has not been able to triage patient. Patient needs triaging and to confirm if Amlodopine is at pharmacy.   Copied from CRM [id number]. Topic: Clinical - Red Word Triage >> Mar 30, 2024  4:36 PM Antwanette L wrote: Red Word that prompted transfer to Nurse Triage: Patient is experiencing high bp. The last time the pt checked his bp was last week. Right now pt is experience bad headaches.

## 2024-03-30 NOTE — Telephone Encounter (Signed)
 3rd attempt, LVM requesting pt return call to office.Routing to clinic for follow up.

## 2024-03-30 NOTE — Telephone Encounter (Signed)
 This RN attempted return call for patient. No answer. LVM. (2nd attempt)

## 2024-04-11 ENCOUNTER — Encounter (HOSPITAL_COMMUNITY): Payer: Self-pay

## 2024-04-11 ENCOUNTER — Emergency Department (HOSPITAL_COMMUNITY)

## 2024-04-11 ENCOUNTER — Other Ambulatory Visit: Payer: Self-pay

## 2024-04-11 ENCOUNTER — Emergency Department (HOSPITAL_COMMUNITY)
Admission: EM | Admit: 2024-04-11 | Discharge: 2024-04-11 | Disposition: A | Discharge status: Home / Self Care | Attending: Emergency Medicine | Admitting: Emergency Medicine

## 2024-04-11 DIAGNOSIS — Z79899 Other long term (current) drug therapy: Secondary | ICD-10-CM | POA: Diagnosis not present

## 2024-04-11 DIAGNOSIS — M25511 Pain in right shoulder: Secondary | ICD-10-CM | POA: Diagnosis present

## 2024-04-11 DIAGNOSIS — I10 Essential (primary) hypertension: Secondary | ICD-10-CM | POA: Diagnosis not present

## 2024-04-11 MED ORDER — KETOROLAC TROMETHAMINE 15 MG/ML IJ SOLN
15.0000 mg | Freq: Once | INTRAMUSCULAR | Status: AC
  Administered 2024-04-11: 15 mg via INTRAMUSCULAR
  Filled 2024-04-11: qty 1

## 2024-04-11 MED ORDER — HYDROCODONE-ACETAMINOPHEN 5-325 MG PO TABS: 1.0000 | ORAL_TABLET | Freq: Four times a day (QID) | ORAL | 0 refills | Status: DC | PRN

## 2024-04-11 MED ORDER — CYCLOBENZAPRINE HCL 10 MG PO TABS: 10.0000 mg | ORAL_TABLET | Freq: Two times a day (BID) | ORAL | 0 refills | Status: DC | PRN

## 2024-04-11 MED ORDER — HYDROCODONE-ACETAMINOPHEN 5-325 MG PO TABS
1.0000 | ORAL_TABLET | Freq: Once | ORAL | Status: AC
  Administered 2024-04-11: 1 via ORAL
  Filled 2024-04-11: qty 1

## 2024-04-11 MED ORDER — NAPROXEN 500 MG PO TABS: 500.0000 mg | ORAL_TABLET | Freq: Two times a day (BID) | ORAL | 0 refills | Status: DC

## 2024-04-11 NOTE — ED Triage Notes (Signed)
 Pt arrived from home via [facility] c/o right shoulder pain 10/10 described as throbbing that began approx 1900 04/10/2024. Pt denies any injury.

## 2024-04-11 NOTE — Discharge Instructions (Signed)
 It was a pleasure taking part in your care.  As we discussed, it appears as if you have calcific rotator cuff tendinopathy based on x-ray imaging.  Please wear shoulder sling for comfort and follow-up with Dr. Abigail Abler, orthopedics.  Please call his office on Monday and make an appointment to be seen.  Take naproxen  twice a day as needed for pain in your right shoulder.  You may also take Flexeril  2 times a day, this is a muscle relaxer, do not take this and drive or operate heavy machinery or mix with alcohol.  At night for your pain, please take hydrocodone  pain medication to help with sleep.  Again, this is narcotic and will cause lightheadedness, dizziness.  Do not drive or operate heavy machinery on taking this, do not mix with alcohol.  Follow-up with Dr. Abigail Abler.  Return to the ED with any new or worsening symptoms.  See work note.

## 2024-04-11 NOTE — Progress Notes (Signed)
 Orthopedic Tech Progress Note Patient Details:  Devin [name] [birth year] [id number]  Ortho Devices Type of Ortho Device: Sling immobilizer Ortho Device/Splint Location: RUE Ortho Device/Splint Interventions: Application   Post Interventions Patient Tolerated: Well  Devin [name] 04/11/2024, 8:26 AM

## 2024-04-11 NOTE — ED Provider Notes (Signed)
 [facility] Provider Note   CSN: [id number] Arrival date & time: 04/11/24  8295     History  Chief Complaint  Patient presents with   Shoulder Pain    Devin [name] is a 63 y.o. male with medical history of hypertension, gout.  The patient presents to the ED for evaluation of right shoulder pain.  States that last night around 7 PM developed right shoulder pain that extends from his right shoulder down to his right wrist.  Denies a history of injury, trauma to account for this pain.  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  Denies any fevers, chest pain, shortness of breath, nausea or vomiting.  Denies any medications prior to arrival.  Denies history of surgical intervention to right shoulder.   Shoulder Pain Associated symptoms: no fever        Home Medications Prior to Admission medications   Medication Sig Start Date End Date Taking? Authorizing Provider  cyclobenzaprine  (FLEXERIL ) 10 MG tablet Take 1 tablet (10 mg total) by mouth 2 (two) times daily as needed for muscle spasms. 04/11/24  Yes Adel Aden, PA-C  HYDROcodone -acetaminophen  (NORCO/VICODIN) 5-325 MG tablet Take 1 tablet by mouth every 6 (six) hours as needed for severe pain (pain score 7-10). 04/11/24  Yes Adel Aden, PA-C  naproxen  (NAPROSYN ) 500 MG tablet Take 1 tablet (500 mg total) by mouth 2 (two) times daily. 04/11/24  Yes Adel Aden, PA-C  acetaminophen  (TYLENOL ) 500 MG tablet Take 2 tablets (1,000 mg total) by mouth every 6 (six) hours as needed for mild pain. 03/13/23   Emmalene Hare, MD  amLODipine  (NORVASC ) 10 MG tablet Take 1 tablet (10 mg total) by mouth daily. 03/13/24   Adelia Homestead, MD  baclofen  (LIORESAL ) 10 MG tablet Take 1 tablet (10 mg total) by mouth 3 (three) times daily.  08/21/23   Adelia Homestead, MD  gabapentin  (NEURONTIN ) 300 MG capsule Take 1 capsule (300 mg total) by mouth 3 (three) times daily. 12/05/22   Adelia Homestead, MD  ibuprofen  (ADVIL ) 600 MG tablet Take 1 tablet (600 mg total) by mouth every 8 (eight) hours as needed for moderate pain. 03/13/23   Emmalene Hare, MD  lisinopril -hydrochlorothiazide  (ZESTORETIC ) 20-25 MG tablet Take 1 tablet by mouth daily. 12/05/22   Adelia Homestead, MD  meloxicam  (MOBIC ) 15 MG tablet Take 1 tablet (15 mg total) by mouth daily. 12/05/22   Adelia Homestead, MD  sildenafil  (VIAGRA ) 100 MG tablet Take 0.5-1 tablets (50-100 mg total) by mouth daily as needed for erectile dysfunction. 06/19/23   Adelia Homestead, MD  traMADol  (ULTRAM ) 50 MG tablet Take 1 tablet (50 mg total) by mouth 3 (three) times daily as needed. 08/21/23   Adelia Homestead, MD      Allergies    Patient has no known allergies.    Review of Systems   Review of Systems  Constitutional:  Negative for fever.  Musculoskeletal:  Positive for arthralgias.  All other systems reviewed and are negative.   Physical Exam Updated Vital Signs BP (!) 158/112 (BP Location: Left Arm)   Pulse 85   Temp 98 F (36.7 C) (Oral)   Resp 18   Ht 6\' 2"  (1.88 m)   Wt 95.3 kg  SpO2 95%   BMI 26.96 kg/m  Physical Exam Vitals and nursing note reviewed.  Constitutional:      General: He is not in acute distress.    Appearance: He is well-developed.  HENT:     Head: Normocephalic and atraumatic.  Eyes:     Conjunctiva/sclera: Conjunctivae normal.  Neck:     Comments: No C-spine tenderness, paracervical spinal tenderness Cardiovascular:     Rate and Rhythm: Normal rate and regular rhythm.     Heart sounds: No murmur heard. Pulmonary:     Effort: Pulmonary effort is normal. No respiratory distress.     Breath sounds: Normal breath sounds.  Abdominal:     Palpations: Abdomen is soft.     Tenderness: There is no abdominal  tenderness.  Musculoskeletal:        General: No swelling.     Right shoulder: Tenderness present. No swelling, deformity or effusion. Decreased range of motion.     Left shoulder: Normal.     Cervical back: Neck supple.     Comments: Tenderness to anterior portion of right shoulder.  Decreased range of motion secondary to pain.  No obvious swelling.  No deformity.  Reduced grip strength to right upper extremity secondary to pain.  Skin:    General: Skin is warm and dry.     Capillary Refill: Capillary refill takes less than 2 seconds.  Neurological:     Mental Status: He is alert and oriented to person, place, and time.  Psychiatric:        Mood and Affect: Mood normal.     ED Results / Procedures / Treatments   Labs (all labs ordered are listed, but only abnormal results are displayed) Labs Reviewed - No data to display  EKG None  Radiology DG Shoulder Right Result Date: 04/11/2024 CLINICAL DATA:  Right shoulder pain and limited range of motion. No injury. EXAM: RIGHT SHOULDER - 2+ VIEW COMPARISON:  None Available. FINDINGS: Three views. There is normal bone mineralization. No evidence of fracture or dislocation. There is a downsloping acromion with distal acromial spurring and spurring of the greater tuberosity. There are clustered calcifications alongside the greater tuberosity consistent with calcific rotator cuff tendinopathy. The glenohumeral joint is unremarkable. There is trace spurring at the [facility] joint. No high riding of the humerus. There are healed fracture deformities posterior right eighth and ninth ribs. Visualized right lung is clear. IMPRESSION: 1. No acute fracture or dislocation. 2. Downsloping acromion with distal acromial spurring and spurring of the greater tuberosity. 3. Calcific rotator cuff tendinopathy. 4. Healed fracture deformities posterior right eighth and ninth ribs. Electronically Signed   By: Denman Fischer M.D.   On: 04/11/2024 07:50     Procedures Procedures    Medications Ordered in ED Medications  HYDROcodone -acetaminophen  (NORCO/VICODIN) 5-325 MG per tablet 1 tablet (1 tablet Oral Given 04/11/24 0816)  ketorolac  (TORADOL ) 15 MG/ML injection 15 mg (15 mg Intramuscular Given 04/11/24 0816)    ED Course/ Medical Decision Making/ A&P  Medical Decision Making Amount and/or Complexity of Data Reviewed Radiology: ordered.  Risk Prescription drug management.   63 year old who presents for evaluation of atraumatic right shoulder pain.  Please see HPI for further details.  On exam patient right shoulder with tenderness to anterior portion of the right shoulder.  No overlying skin change, no obvious swelling, no deformity.  There is decreased range of motion secondary to pain.  Reduced grip strength of his right upper extremity secondary to pain.  No  cervical spine tenderness so doubt cervical radiculopathy.  He has issues with flexion, extension of the right shoulder.  Question some kind of rotator cuff injury possibly.  Patient reports he lifts pallets off of structures above his head often.  Will collect x-ray imaging, give Toradol  and hydrocodone .  X-ray of right shoulder shows calcific rotator cuff tendinopathy.  Other nonacute findings as well.  Will place patient in shoulder sling and have him follow-up with orthopedics.  He will be sent home with small amount of pain medication and naproxen , Flexeril .  Advised to follow-up with orthopedics.  Given return precautions and he voiced understanding.  Stable to discharge.   Final Clinical Impression(s) / ED Diagnoses Final diagnoses:  Acute pain of right shoulder    Rx / DC Orders ED Discharge Orders          Ordered    HYDROcodone -acetaminophen  (NORCO/VICODIN) 5-325 MG tablet  Every 6 hours PRN        04/11/24 0821    naproxen  (NAPROSYN ) 500 MG tablet  2 times daily        04/11/24 0821    cyclobenzaprine  (FLEXERIL ) 10 MG tablet  2 times daily PRN         04/11/24 0821              Adel Aden, PA-C 04/11/24 0825    Roberts Ching, MD 04/21/24 1737

## 2024-04-14 ENCOUNTER — Telehealth: Payer: Self-pay

## 2024-04-14 NOTE — Transitions of Care (Post Inpatient/ED Visit) (Signed)
   04/14/2024  Name: TONEY DIFATTA MRN: [id number] DOB: Aug 03, 1961  Today's TOC FU Call Status: Today's TOC FU Call Status:: Unsuccessful Call (1st Attempt) Unsuccessful Call (1st Attempt) Date: 04/14/24  Attempted to reach the patient regarding the most recent Inpatient/ED visit.  Follow Up Plan: No further outreach attempts will be made at this time. We have been unable to contact the patient.  Signature :Jamel Dunton,CMA

## 2024-06-22 ENCOUNTER — Ambulatory Visit: Payer: Self-pay

## 2024-06-22 NOTE — Telephone Encounter (Signed)
 FYI Only or Action Required?: FYI only for provider.  Patient was last seen in primary care on 08/21/2023 by Rollene Almarie LABOR, MD. Called Nurse Triage reporting Back Pain. Symptoms began a week ago. Interventions attempted: Nothing. Symptoms are: unchanged.  Triage Disposition: See HCP Within 4 Hours (Or PCP Triage)  Patient/caregiver understands and will follow disposition?: Yes  ** A sooner appt. Was offered for 6/30; patient wishes to bee seen by PCP, he is scheduled for 7/1 with Dr. Rollene**                    Copied from CRM [id number]. Topic: Clinical - Red Word Triage >> Jun 22, 2024 11:10 AM Deaijah H wrote: Red Word that prompted transfer to Nurse Triage: Severe back pain Reason for Disposition  [1] SEVERE back pain (e.g., excruciating, unable to do any normal activities) AND [2] not improved 2 hours after pain medicine  Answer Assessment - Initial Assessment Questions 1. ONSET: When did the pain begin?      X 1 week   2. LOCATION: Where does it hurt? (upper, mid or lower back)     Lower  3. SEVERITY: How bad is the pain?  (e.g., Scale 1-10; mild, moderate, or severe)   - MILD (1-3): Doesn't interfere with normal activities.    - MODERATE (4-7): Interferes with normal activities or awakens from sleep.    - SEVERE (8-10): Excruciating pain, unable to do any normal activities.     10/10   4. PATTERN: Is the pain constant? (e.g., yes, no; constant, intermittent)      Constant   5. RADIATION: Does the pain shoot into your legs or somewhere else?     Right leg  6. CAUSE:  What do you think is causing the back pain?      Unknown   7. BACK OVERUSE:  Any recent lifting of heavy objects, strenuous work or exercise?     No   8. MEDICINES: What have you taken so far for the pain? (e.g., nothing, acetaminophen , NSAIDS)     No   9. NEUROLOGIC SYMPTOMS: Do you have any weakness, numbness, or problems with bowel/bladder control?     No    10. OTHER SYMPTOMS: Do you have any other symptoms? (e.g., fever, abdomen pain, burning with urination, blood in urine)       No  Protocols used: Back Pain-A-AH

## 2024-06-23 ENCOUNTER — Ambulatory Visit: Admitting: Internal Medicine

## 2024-06-24 ENCOUNTER — Ambulatory Visit: Admitting: Internal Medicine

## 2024-08-25 ENCOUNTER — Ambulatory Visit: Payer: Self-pay

## 2024-08-25 NOTE — Telephone Encounter (Signed)
  FYI Only or Action Required?: FYI only for provider.  Patient was last seen in primary care on 08/21/2023 by Rollene Almarie LABOR, MD.  Called Nurse Triage reporting Foot Pain.  Symptoms began about a month ago.  Interventions attempted: OTC medications: Tylenol .  Symptoms are: gradually worsening.  Triage Disposition: See PCP When Office is Open (Within 3 Days)  Patient/caregiver understands and will follow disposition?: Yes                             Copied from CRM [id number]. Topic: Clinical - Red Word Triage >> Aug 25, 2024  8:58 AM Charlet HERO wrote: Red Word that prompted transfer to Nurse Triage: Patient is stating that his foot is hurting  really bad that he can barely stand or walk on it when it hurts really bad. He has spoke to Dr. Rollene about the issue before but it is getting worse. Reason for Disposition  [1] MODERATE pain (e.g., interferes with normal activities, limping) AND [2] present > 3 days  Answer Assessment - Initial Assessment Questions 1. ONSET: When did the pain start?      A month ago, worsening 2. LOCATION: Where is the pain located?      Bottom of right foot 3. PAIN: How bad is the pain?    (Scale 1-10; or mild, moderate, severe)     Rates pain 8-9 at this time  4. WORK OR EXERCISE: Has there been any recent work or exercise that involved this part of the body?      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 CAUSE: What do you think is causing the foot pain?     Unsure 6. OTHER SYMPTOMS: Do you have any other symptoms? (e.g., leg pain, rash, fever, numbness)     Bottom of foot is red, states he is able to walk around normally, states walking is bearable, denies numbness, denies weakness, denies swelling, denies fever, denies rash, denies leg pain    States he has seen his PCP for this before and symptoms are worsening.  Protocols used: Foot Pain-A-AH

## 2024-08-26 ENCOUNTER — Ambulatory Visit: Payer: Self-pay | Admitting: Emergency Medicine

## 2024-08-26 ENCOUNTER — Ambulatory Visit (INDEPENDENT_AMBULATORY_CARE_PROVIDER_SITE_OTHER)

## 2024-08-26 ENCOUNTER — Encounter: Payer: Self-pay | Admitting: Emergency Medicine

## 2024-08-26 ENCOUNTER — Ambulatory Visit (INDEPENDENT_AMBULATORY_CARE_PROVIDER_SITE_OTHER): Admitting: Emergency Medicine

## 2024-08-26 VITALS — BP 132/90 | HR 73 | Temp 98.0°F | Ht 74.0 in | Wt 258.0 lb

## 2024-08-26 DIAGNOSIS — M19071 Primary osteoarthritis, right ankle and foot: Secondary | ICD-10-CM | POA: Diagnosis not present

## 2024-08-26 DIAGNOSIS — M79671 Pain in right foot: Secondary | ICD-10-CM

## 2024-08-26 MED ORDER — MELOXICAM 15 MG PO TABS: 15.0000 mg | ORAL_TABLET | Freq: Every day | ORAL | 1 refills | Status: AC

## 2024-08-26 NOTE — Assessment & Plan Note (Signed)
 Clinically stable.  No red flag signs or symptoms. Related to activities of daily work. Pain management discussed. Recommend meloxicam  50 mg daily for 2 weeks Recommend x-ray today.  Will review images when ready. Recommend podiatry evaluation.  Referral placed today.

## 2024-08-26 NOTE — Patient Instructions (Signed)
 Foot Pain Many things can cause foot pain. Common causes include injuries to the foot. The injuries include sprains or broken bones, or injuries that affect the nerves in the feet. Other causes of foot pain include arthritis, blisters, and bunions. To know what causes your foot pain, your health care provider will take a detailed history of your symptoms. They will also do a physical exam as well as imaging tests, such as X-ray or MRI. Follow these instructions at home: Managing pain, stiffness, and swelling  If told, put ice on the painful area. Put ice in a plastic bag. Place a towel between your skin and the bag. Leave the ice on for 20 minutes, 2-3 times a day. If your skin turns bright red, remove the ice right away to prevent skin damage. The risk of damage is higher if you cannot feel pain, heat, or cold. Activity Do not stand or walk for long periods. Do stretches to relieve foot pain and stiffness as told by your provider. Do not lift anything that is heavier than 10 lb (4.5 kg), or the limit that you are told, until your provider says that it is safe. Lifting a lot of weight can put added pressure on your feet. Return to your normal activities as told by your provider. Ask your provider what activities are safe for you. Lifestyle Wear comfortable, supportive shoes that fit you well. Do not wear high heels. Keep your feet clean and dry. General instructions Take over-the-counter and prescription medicines only as told by your provider. Rub your foot gently. Pay attention to any changes in your symptoms. Let your provider know if symptoms become worse. Keep all follow-up visits. Your provider will want to monitor your progress. Contact a health care provider if: Your pain does not get better after a few days of treatment at home. Your pain gets worse. You cannot stand on your foot. Your foot or toes are swollen. Your foot is numb or tingling. Get help right away if: Your foot  or toes turn white or blue. You have warmth and redness along your foot. This information is not intended to replace advice given to you by your health care provider. Make sure you discuss any questions you have with your health care provider. Document Revised: 01/03/2023 Document Reviewed: 09/11/2022 Elsevier Patient Education  2024 [employer].

## 2024-08-26 NOTE — Progress Notes (Signed)
 Devin [name] 63 y.o.   Chief Complaint  Patient presents with   Foot Pain    Patient here for right foot pain started a month ago and has gotten worse. Patient states it is constant aches when he stand. He mentions that his lower back started to hurt last night. He is only taking tylenol  which does not help with the pain.    HISTORY OF PRESENT ILLNESS: Acute problem visit today This is a 63 y.o. male complaining of sharp pain to left foot almost constantly for the past 4 to 6 weeks Constantly on his feet at work.  Works night shifts Started having right lower lumbar pain last night No other associated symptoms No other complaints or medical concerns today  Foot Pain Pertinent negatives include no abdominal pain, chest pain, chills, congestion, coughing, fever, headaches, nausea, rash, sore throat or vomiting.     Prior to Admission medications   Medication Sig Start Date End Date Taking? Authorizing Provider  acetaminophen  (TYLENOL ) 500 MG tablet Take 2 tablets (1,000 mg total) by mouth every 6 (six) hours as needed for mild pain. 03/13/23  Yes Piscoya, Aloysius, MD  amLODipine  (NORVASC ) 10 MG tablet Take 1 tablet (10 mg total) by mouth daily. 03/13/24  Yes Rollene Almarie LABOR, MD  ibuprofen  (ADVIL ) 600 MG tablet Take 1 tablet (600 mg total) by mouth every 8 (eight) hours as needed for moderate pain. 03/13/23  Yes Piscoya, Aloysius, MD  lisinopril -hydrochlorothiazide  (ZESTORETIC ) 20-25 MG tablet Take 1 tablet by mouth daily. 12/05/22  Yes Rollene Almarie LABOR, MD  baclofen  (LIORESAL ) 10 MG tablet Take 1 tablet (10 mg total) by mouth 3 (three) times daily. Patient not taking: Reported on 08/26/2024 08/21/23   Rollene Almarie LABOR, MD  cyclobenzaprine  (FLEXERIL ) 10 MG tablet Take 1 tablet (10 mg total) by mouth 2 (two) times daily as needed for muscle spasms. Patient not taking: Reported on 08/26/2024 04/11/24   Ruthell Lonni FALCON, PA-C  gabapentin  (NEURONTIN ) 300 MG capsule Take 1 capsule (300 mg  total) by mouth 3 (three) times daily. Patient not taking: Reported on 08/26/2024 12/05/22   Rollene Almarie LABOR, MD  HYDROcodone -acetaminophen  (NORCO/VICODIN) 5-325 MG tablet Take 1 tablet by mouth every 6 (six) hours as needed for severe pain (pain score 7-10). Patient not taking: Reported on 08/26/2024 04/11/24   Ruthell Lonni FALCON, PA-C  meloxicam  (MOBIC ) 15 MG tablet Take 1 tablet (15 mg total) by mouth daily. Patient not taking: Reported on 08/26/2024 12/05/22   Rollene Almarie LABOR, MD  naproxen  (NAPROSYN ) 500 MG tablet Take 1 tablet (500 mg total) by mouth 2 (two) times daily. Patient not taking: Reported on 08/26/2024 04/11/24   Ruthell Lonni FALCON, PA-C  sildenafil  (VIAGRA ) 100 MG tablet Take 0.5-1 tablets (50-100 mg total) by mouth daily as needed for erectile dysfunction. Patient not taking: Reported on 08/26/2024 06/19/23   Rollene Almarie LABOR, MD  traMADol  (ULTRAM ) 50 MG tablet Take 1 tablet (50 mg total) by mouth 3 (three) times daily as needed. Patient not taking: Reported on 08/26/2024 08/21/23   Rollene Almarie LABOR, MD    No Known Allergies  Patient Active Problem List   Diagnosis Date Noted   Ingrown toenail of left foot 02/07/2023   Acute gout involving toe of left foot 07/10/2022   Encounter for general adult medical examination with abnormal findings 07/10/2022   Pre-diabetes 07/10/2022   Ganglion cyst of right foot 05/31/2022   Numbness in both hands 02/07/2021   Vitiligo 10/09/2017   Right  lumbar radiculopathy 07/31/2017   ED (erectile dysfunction) 12/27/2015   Essential hypertension 12/27/2015   Obesity 12/27/2015   Tobacco abuse 12/27/2015    Past Medical History:  Diagnosis Date   Gout    Hypertension    Lipoma of back 02/2023   Pre-diabetes     Past Surgical History:  Procedure Laterality Date   COLONOSCOPY     KNEE SURGERY Left    torn meniscus   LIPOMA EXCISION N/A 03/13/2023   Procedure: EXCISION LIPOMA, mid back;  Surgeon: Desiderio Schanz, MD;   Location: [facility] ORS;  Service: General;  Laterality: N/A;    Social History   Socioeconomic History   Marital status: Divorced    Spouse name: Not on file   Number of children: 4   Years of education: Not on file   Highest education level: Not on file  Occupational History   Not on file  Tobacco Use   Smoking status: Every Day    Current packs/day: 0.50    Types: Cigarettes   Smokeless tobacco: Never  Vaping Use   Vaping status: Never Used  Substance and Sexual Activity   Alcohol use: No   Drug use: No   Sexual activity: Not on file  Other Topics Concern   Not on file  Social History Narrative   Lives with fiance   Social Drivers of Health   Financial Resource Strain: Not on file  Food Insecurity: Not on file  Transportation Needs: Not on file  Physical Activity: Not on file  Stress: Not on file  Social Connections: Not on file  Intimate Partner Violence: Not on file    Family History  Problem Relation Age of Onset   Cancer Mother    Cancer Father    Stroke Father    Colon cancer Neg Hx      Review of Systems  Constitutional: Negative.  Negative for chills and fever.  HENT: Negative.  Negative for congestion and sore throat.   Respiratory: Negative.  Negative for cough and shortness of breath.   Cardiovascular: Negative.  Negative for chest pain and palpitations.  Gastrointestinal:  Negative for abdominal pain, diarrhea, nausea and vomiting.  Genitourinary: Negative.  Negative for dysuria and hematuria.  Skin: Negative.  Negative for rash.  Neurological: Negative.  Negative for dizziness and headaches.  All other systems reviewed and are negative.   Vitals:   08/26/24 0842  BP: (!) 132/90  Pulse: 73  Temp: 98 F (36.7 C)  SpO2: 96%    Physical Exam Vitals reviewed.  Constitutional:      Appearance: Normal appearance.  HENT:     Head: Normocephalic.  Eyes:     Extraocular Movements: Extraocular movements intact.  Cardiovascular:     Rate  and Rhythm: Normal rate.  Pulmonary:     Effort: Pulmonary effort is normal.  Musculoskeletal:     Comments: Right foot: Warm to touch.  No skin breakdowns.  No lacerations.  No erythema or bruising noted.  Good distal circulation.  Good distal sensation.  Skin:    General: Skin is warm and dry.     Comments: Vitiligo  Neurological:     General: No focal deficit present.     Mental Status: He is alert and oriented to person, place, and time.  Psychiatric:        Mood and Affect: Mood normal.        Behavior: Behavior normal.    DG Foot 2 Views Right Result Date: 08/26/2024  EXAM: 1 or 2 VIEW(S) XRAY OF THE RIGHT FOOT 08/26/2024 09:01:15 AM COMPARISON: Right foot series dated 11/29/2021. CLINICAL HISTORY: Right foot pain x 1 month, NKI. FINDINGS: BONES AND JOINTS: No acute fracture. No focal osseous lesion. No joint dislocation. Marked degenerative changes again demonstrated in the first metatarsophalangeal joint. SOFT TISSUES: The soft tissues are unremarkable. IMPRESSION: 1. 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 Electronically signed by: Evalene Coho MD 08/26/2024 09:08 AM EDT RP Workstation: [id number]     ASSESSMENT & PLAN: Problem List Items Addressed This Visit       Other   Right foot pain - Primary   Clinically stable.  No red flag signs or symptoms. Related to activities of daily work. Pain management discussed. Recommend meloxicam  50 mg daily for 2 weeks Recommend x-ray today.  Will review images when ready. Recommend podiatry evaluation.  Referral placed today.      Relevant Medications   meloxicam  (MOBIC ) 15 MG tablet   Other Relevant Orders   DG Foot 2 Views Right   Ambulatory referral to Podiatry   Patient Instructions  Foot Pain Many things can cause foot pain. Common causes include injuries to the foot. The injuries include sprains or broken bones, or injuries that affect the nerves in  the feet. Other causes of foot pain include arthritis, blisters, and bunions. To know what causes your foot pain, your health care provider will take a detailed history of your symptoms. They will also do a physical exam as well as imaging tests, such as X-ray or MRI. Follow these instructions at home: Managing pain, stiffness, and swelling  If told, put ice on the painful area. Put ice in a plastic bag. Place a towel between your skin and the bag. Leave the ice on for 20 minutes, 2-3 times a day. If your skin turns bright red, remove the ice right away to prevent skin damage. The risk of damage is higher if you cannot feel pain, heat, or cold. Activity Do not stand or walk for long periods. Do stretches to relieve foot pain and stiffness as told by your provider. Do not lift anything that is heavier than 10 lb (4.5 kg), or the limit that you are told, until your provider says that it is safe. Lifting a lot of weight can put added pressure on your feet. Return to your normal activities as told by your provider. Ask your provider what activities are safe for you. Lifestyle Wear comfortable, supportive shoes that fit you well. Do not wear high heels. Keep your feet clean and dry. General instructions Take over-the-counter and prescription medicines only as told by your provider. Rub your foot gently. Pay attention to any changes in your symptoms. Let your provider know if symptoms become worse. Keep all follow-up visits. Your provider will want to monitor your progress. Contact a health care provider if: Your pain does not get better after a few days of treatment at home. Your pain gets worse. You cannot stand on your foot. Your foot or toes are swollen. Your foot is numb or tingling. Get help right away if: Your foot or toes turn white or blue. You have warmth and redness along your foot. This information is not intended to replace advice given to you by your health care provider. Make  sure you discuss any questions you have with your health care provider. Document Revised: 01/03/2023 Document Reviewed: 09/11/2022 Elsevier Patient Education  2024 [employer].  Emil Schaumann, MD [facility]

## 2024-09-27 ENCOUNTER — Other Ambulatory Visit: Payer: Self-pay | Admitting: Internal Medicine

## 2024-09-27 DIAGNOSIS — I10 Essential (primary) hypertension: Secondary | ICD-10-CM

## 2024-10-09 ENCOUNTER — Ambulatory Visit: Payer: Self-pay | Admitting: Internal Medicine

## 2024-10-09 ENCOUNTER — Ambulatory Visit: Payer: Self-pay

## 2024-10-09 NOTE — Telephone Encounter (Signed)
 Called [facility], spoke with Antonio, no available appts and unable to schedule work in visit today, with any providers.

## 2024-10-09 NOTE — Telephone Encounter (Signed)
 FYI Only or Action Required?: Action required by provider: request for appointment and medication refill request.  Patient was last seen in primary care on 08/26/2024 by Purcell Emil Schanz, MD.  Called Nurse Triage reporting Foot Pain.  Symptoms began several days ago.  Interventions attempted: Nothing.  Symptoms are: gradually worsening.  Triage Disposition: See Physician Within 24 Hours  Patient/caregiver understands and will follow disposition?: Yes  Copied from CRM [id number]. Topic: Clinical - Red Word Triage >> Oct 09, 2024  3:21 PM Anairis L wrote: [facility] that prompted transfer to Nurse Triage:Severe Right foot pain, works standing. Reason for Disposition  [1] Redness of the skin AND [2] no fever    No redness, pt reports 8/10 pain  Answer Assessment - Initial Assessment Questions No available appts today. Advised UC today and ED if symptoms worsen.   1. ONSET: When did the pain start?      3 days ago 2. LOCATION: Where is the pain located?      Right foot 3. PAIN: How bad is the pain?    (Scale 1-10; or mild, moderate, severe)     8/10;tramadol  takes at night, but out of medication 4. WORK OR EXERCISE: Has there been any recent work or exercise that involved this part of the body?      Work on concrete 5. CAUSE: What do you think is causing the foot pain?     Work related wears boots 6. OTHER SYMPTOMS: Do you have any other symptoms? (e.g., leg pain, rash, fever, numbness)     Denies numbness/weakness foot, redness, swelling,  Reports have to walk on side of foot, due to pain.  Protocols used: Foot Pain-A-AH

## 2024-10-09 NOTE — Telephone Encounter (Signed)
 Called patient and LVM to schedule with sooner appointment

## 2024-10-12 NOTE — Telephone Encounter (Signed)
Pt has been scheduled on the 27th

## 2024-10-19 ENCOUNTER — Encounter: Payer: Self-pay | Admitting: Internal Medicine

## 2024-10-19 ENCOUNTER — Ambulatory Visit (INDEPENDENT_AMBULATORY_CARE_PROVIDER_SITE_OTHER): Admitting: Internal Medicine

## 2024-10-19 VITALS — BP 140/90 | HR 71 | Temp 98.7°F | Ht 74.0 in | Wt 260.0 lb

## 2024-10-19 DIAGNOSIS — R7303 Prediabetes: Secondary | ICD-10-CM

## 2024-10-19 DIAGNOSIS — M1A9XX0 Chronic gout, unspecified, without tophus (tophi): Secondary | ICD-10-CM | POA: Diagnosis not present

## 2024-10-19 DIAGNOSIS — Z0000 Encounter for general adult medical examination without abnormal findings: Secondary | ICD-10-CM | POA: Diagnosis not present

## 2024-10-19 DIAGNOSIS — Z0001 Encounter for general adult medical examination with abnormal findings: Secondary | ICD-10-CM

## 2024-10-19 DIAGNOSIS — Z23 Encounter for immunization: Secondary | ICD-10-CM | POA: Diagnosis not present

## 2024-10-19 DIAGNOSIS — M79671 Pain in right foot: Secondary | ICD-10-CM

## 2024-10-19 DIAGNOSIS — Z720 Tobacco use: Secondary | ICD-10-CM | POA: Diagnosis not present

## 2024-10-19 DIAGNOSIS — I10 Essential (primary) hypertension: Secondary | ICD-10-CM

## 2024-10-19 DIAGNOSIS — Z1211 Encounter for screening for malignant neoplasm of colon: Secondary | ICD-10-CM

## 2024-10-19 LAB — LIPID PANEL
Cholesterol: 188 mg/dL (ref 0–200)
HDL: 55 mg/dL (ref >39.00)
LDL Cholesterol: 118 mg/dL — ABNORMAL HIGH (ref 0–99)
NonHDL: 133.08
Total CHOL/HDL Ratio: 3
Triglycerides: 74 mg/dL (ref 0.0–149.0)
VLDL: 14.8 mg/dL (ref 0.0–40.0)

## 2024-10-19 LAB — CBC
HCT: 44.2 % (ref 39.0–52.0)
Hemoglobin: 14.3 g/dL (ref 13.0–17.0)
MCHC: 32.5 g/dL (ref 30.0–36.0)
MCV: 86.3 fl (ref 78.0–100.0)
Platelets: 265 K/uL (ref 150.0–400.0)
RBC: 5.12 Mil/uL (ref 4.22–5.81)
RDW: 15.6 % — ABNORMAL HIGH (ref 11.5–15.5)
WBC: 6.9 K/uL (ref 4.0–10.5)

## 2024-10-19 LAB — COMPREHENSIVE METABOLIC PANEL WITH GFR
ALT: 11 U/L (ref 0–53)
AST: 15 U/L (ref 0–37)
Albumin: 4 g/dL (ref 3.5–5.2)
Alkaline Phosphatase: 69 U/L (ref 39–117)
BUN: 11 mg/dL (ref 6–23)
CO2: 27 meq/L (ref 19–32)
Calcium: 8.8 mg/dL (ref 8.4–10.5)
Chloride: 106 meq/L (ref 96–112)
Creatinine, Ser: 0.65 mg/dL (ref 0.40–1.50)
GFR: 99.15 mL/min (ref >60.00)
Glucose, Bld: 91 mg/dL (ref 70–99)
Potassium: 3.8 meq/L (ref 3.5–5.1)
Sodium: 139 meq/L (ref 135–145)
Total Bilirubin: 0.3 mg/dL (ref 0.2–1.2)
Total Protein: 7.4 g/dL (ref 6.0–8.3)

## 2024-10-19 LAB — HEMOGLOBIN A1C: Hgb A1c MFr Bld: 6.3 % (ref 4.6–6.5)

## 2024-10-19 LAB — URIC ACID: Uric Acid, Serum: 4.2 mg/dL (ref 4.0–7.8)

## 2024-10-19 MED ORDER — BUPROPION HCL ER (XL) 150 MG PO TB24: 150.0000 mg | ORAL_TABLET | Freq: Every day | ORAL | 1 refills | Status: AC

## 2024-10-19 MED ORDER — TRAMADOL HCL 50 MG PO TABS: 50.0000 mg | ORAL_TABLET | Freq: Two times a day (BID) | ORAL | 1 refills | Status: DC | PRN

## 2024-10-19 NOTE — Assessment & Plan Note (Signed)
 Checking HgA1c and adjust as needed.

## 2024-10-19 NOTE — Progress Notes (Signed)
 Subjective:    Devin [name] is a 62 y.o. male who presents for a Welcome to Medicare exam.       Objective:    Today's Vitals   10/19/24 1021 10/19/24 1036 10/19/24 1132  BP: (!) 150/90  (!) 140/90  Pulse: 71    Temp: 98.7 F (37.1 C)    TempSrc: Oral    SpO2: 95%    Weight: 260 lb (117.9 kg)    Height: 6' 2 (1.88 m)    PainSc: 9  9     Body mass index is 33.38 kg/m.  Medications Outpatient Encounter Medications as of 10/19/2024  Medication Sig   amLODipine  (NORVASC ) 10 MG tablet Take 1 tablet by mouth once daily   buPROPion (WELLBUTRIN XL) 150 MG 24 hr tablet Take 1 tablet (150 mg total) by mouth daily.   ibuprofen  (ADVIL ) 600 MG tablet Take 1 tablet (600 mg total) by mouth every 8 (eight) hours as needed for moderate pain.   lisinopril -hydrochlorothiazide  (ZESTORETIC ) 20-25 MG tablet Take 1 tablet by mouth daily.   sildenafil  (VIAGRA ) 100 MG tablet Take 0.5-1 tablets (50-100 mg total) by mouth daily as needed for erectile dysfunction. (Patient not taking: Reported on 10/19/2024)   traMADol  (ULTRAM ) 50 MG tablet Take 1 tablet (50 mg total) by mouth every 12 (twelve) hours as needed.   [DISCONTINUED] acetaminophen  (TYLENOL ) 500 MG tablet Take 2 tablets (1,000 mg total) by mouth every 6 (six) hours as needed for mild pain. (Patient not taking: Reported on 10/19/2024)   [DISCONTINUED] baclofen  (LIORESAL ) 10 MG tablet Take 1 tablet (10 mg total) by mouth 3 (three) times daily. (Patient not taking: Reported on 10/19/2024)   [DISCONTINUED] cyclobenzaprine  (FLEXERIL ) 10 MG tablet Take 1 tablet (10 mg total) by mouth 2 (two) times daily as needed for muscle spasms. (Patient not taking: Reported on 10/19/2024)   [DISCONTINUED] gabapentin  (NEURONTIN ) 300 MG capsule Take 1 capsule (300 mg total) by mouth 3 (three) times daily. (Patient not taking: Reported on 10/19/2024)   [DISCONTINUED] HYDROcodone -acetaminophen  (NORCO/VICODIN) 5-325 MG tablet Take 1 tablet by mouth every 6 (six) hours  as needed for severe pain (pain score 7-10). (Patient not taking: Reported on 10/19/2024)   [DISCONTINUED] meloxicam  (MOBIC ) 15 MG tablet Take 1 tablet (15 mg total) by mouth daily. (Patient not taking: Reported on 10/19/2024)   [DISCONTINUED] traMADol  (ULTRAM ) 50 MG tablet Take 1 tablet (50 mg total) by mouth 3 (three) times daily as needed. (Patient not taking: Reported on 10/19/2024)   No facility-administered encounter medications on file as of 10/19/2024.     History: Past Medical History:  Diagnosis Date   Gout    Hypertension    Lipoma of back 02/2023   Pre-diabetes    Past Surgical History:  Procedure Laterality Date   COLONOSCOPY     KNEE SURGERY Left    torn meniscus   LIPOMA EXCISION N/A 03/13/2023   Procedure: EXCISION LIPOMA, mid back;  Surgeon: Desiderio Schanz, MD;  Location: [facility] ORS;  Service: General;  Laterality: N/A;    Family History  Problem Relation Age of Onset   Cancer Mother    Cancer Father    Stroke Father    Colon cancer Neg Hx    Social History   Occupational History   Not on file  Tobacco Use   Smoking status: Every Day    Current packs/day: 0.50    Types: Cigarettes   Smokeless tobacco: Never  Vaping Use   Vaping status: Never Used  Substance and Sexual Activity   Alcohol use: No   Drug use: No   Sexual activity: Not on file    Tobacco Counseling Ready to quit: Not Answered Counseling given: Not Answered   Immunizations and Health Maintenance Immunization History  Administered Date(s) Administered   INFLUENZA, HIGH DOSE SEASONAL PF 10/19/2024   Influenza, Seasonal, Injecte, Preservative Fre 08/27/2023   Influenza,inj,Quad PF,6+ Mos 12/27/2015, 10/09/2017, 10/10/2021, 12/05/2022   Influenza-Unspecified 10/24/2020   PFIZER(Purple Top)SARS-COV-2 Vaccination 05/12/2020, 06/02/2020   PNEUMOCOCCAL CONJUGATE-20 10/19/2024   Tdap 12/27/2015   Zoster Recombinant(Shingrix) 10/10/2021   Health Maintenance Due  Topic Date Due    Medicare Annual Wellness (AWV)  Never done   Colonoscopy  10/22/2021   Zoster Vaccines- Shingrix (2 of 2) 12/05/2021    Activities of Daily Living    10/19/2024   10:25 AM  In your present state of health, do you have any difficulty performing the following activities:  Hearing? 0  Vision? 0  Difficulty concentrating or making decisions? 0  Walking or climbing stairs? 0  Dressing or bathing? 0  Doing errands, shopping? 0  Preparing Food and eating ? N  Using the Toilet? N  In the past six months, have you accidently leaked urine? N  Do you have problems with loss of bowel control? N  Managing your Medications? N  Managing your Finances? N  Housekeeping or managing your Housekeeping? N    Physical Exam   See other note   Advanced Directives: Does Patient Have a Medical Advance Directive?: No Would patient like information on creating a medical advance directive?: Yes (MAU/Ambulatory/Procedural Areas - Information given)   EKG:  unchanged from previous tracings, normal sinus rhythm, nonspecific ST and T waves changes, left axis deviation     Assessment:    This is a routine wellness  examination for this patient .   Vision/Hearing screen Vision Screening   Right eye Left eye Both eyes  Without correction     With correction 20/25 20/25 20/20   Hearing Screening - Comments:: Normal to whisper bilaterally    Goals      Patient Stated     I am happy to turn 64 years old.          Depression Screen    10/19/2024   10:54 AM 10/19/2024   10:30 AM 08/26/2024    8:47 AM 08/21/2023   11:07 AM  PHQ 2/9 Scores  PHQ - 2 Score 0 0 0 0  PHQ- 9 Score    0     Fall Risk    10/19/2024   10:29 AM  Fall Risk   Falls in the past year? 0  Number falls in past yr: 0  Injury with Fall? 0  Risk for fall due to : No Fall Risks    Cognitive Function        10/19/2024   10:40 AM  6CIT Screen  What Year? 0 points  What time? 0 points  Count back from 20 0  points  Months in reverse 0 points  Repeat phrase 2 points   Patient Care Team: Rollene Almarie LABOR, MD as PCP - General (Internal Medicine)     Plan:   Prevnar 20 given at visit and flu shot given at visit  I have personally reviewed and noted the following in the patient's chart:   Medical and social history Use of alcohol, tobacco or illicit drugs  Current medications and supplements including opioid prescriptions. Patient is currently taking  opioid prescriptions. Information provided to patient regarding non-opioid alternatives. Patient advised to discuss non-opioid treatment plan with their provider. Functional ability and status Nutritional status Physical activity Advanced directives List of other physicians Hospitalizations, surgeries, and ER visits in previous 12 months Vitals Screenings to include cognitive, depression, and falls Referrals and appointments  In addition, I have reviewed and discussed with patient certain preventive protocols, quality metrics, and best practice recommendations. A written personalized care plan for preventive services as well as general preventive health recommendations were provided to patient.    Almarie DELENA Cleveland, MD 10/19/2024

## 2024-10-19 NOTE — Patient Instructions (Addendum)
 We have sent in the tramadol  to use up to twice a day for pain in the left foot. We will get you in with the foot doctor to help that corn to have less pain overall.   Devin [name] , Thank you for taking time to come for your Medicare Wellness Visit. I appreciate your ongoing commitment to your health goals. Please review the following plan we discussed and let me know if I can assist you in the future.   These are the goals we discussed:  Goals      Patient Stated     I am happy to turn 63 years old.         This is a list of the screening recommended for you and due dates:  Health Maintenance  Topic Date Due   Medicare Annual Wellness Visit  Never done   Colon Cancer Screening  10/22/2021   Zoster (Shingles) Vaccine (2 of 2) 12/05/2021   DTaP/Tdap/Td vaccine (2 - Td or Tdap) 12/26/2025   Pneumococcal Vaccine for age over 61  Completed   Flu Shot  Completed   Hepatitis C Screening  Completed   HIV Screening  Completed   Hepatitis B Vaccine  Aged Out   Meningitis B Vaccine  Aged Out   COVID-19 Vaccine  Discontinued

## 2024-10-19 NOTE — Assessment & Plan Note (Signed)
 Rx wellbutrin to help with stopping smoking. Rx done today and counseled about need to set a quit date.

## 2024-10-19 NOTE — Assessment & Plan Note (Signed)
 Checking uric acid and adjust as needed. No clear gout flare today.

## 2024-10-19 NOTE — Assessment & Plan Note (Signed)
 Flu shot given. Pneumonia 20 given. Shingrix due at pharmacy. Tetanus up to date. Colonoscopy due referral to GI placed. Counseled about sun safety and mole surveillance. Counseled about the dangers of distracted driving. Given 10 year screening recommendations.

## 2024-10-19 NOTE — Assessment & Plan Note (Signed)
 BP borderline today but typically at goal. Continue amlodipine  10 mg daily and lisinopril /hydrochlorothiazide . Checking CMP and adjust as needed.

## 2024-10-19 NOTE — Progress Notes (Signed)
   Subjective:   Patient ID: Devin [name], male    DOB: 08/05/1961, 63 y.o.   MRN: [id number]  The patient is here for physical. Pertinent topics discussed: Discussed the use of AI scribe software for clinical note transcription with the patient, who gave verbal consent to proceed.  History of Present Illness Devin [name] is a 63 year old male who presents with foot pain and for  wellness visit.  He experiences significant pain in his foot, describing it as 'really, really hurting bad.' The pain is localized to specific areas, which he suspects might be corns. He has been using tramadol  for pain relief, which has provided some relief.  He recently turned 46 and is attending a Medicare wellness visit. During this visit, he received a flu shot. He is also interested in undergoing colon cancer screening.  He is smoking less than before, now less than half a pack a day, and continues to work every night, seven days a week. He is considering medication to help reduce his smoking habit.  PMH, [facility], social history reviewed and updated  Review of Systems  Constitutional: Negative.   HENT: Negative.    Eyes: Negative.   Respiratory:  Negative for cough, chest tightness and shortness of breath.   Cardiovascular:  Negative for chest pain, palpitations and leg swelling.  Gastrointestinal:  Negative for abdominal distention, abdominal pain, constipation, diarrhea, nausea and vomiting.  Musculoskeletal:  Positive for arthralgias.  Skin: Negative.   Neurological: Negative.   Psychiatric/Behavioral: Negative.      Objective:  Physical Exam Constitutional:      Appearance: He is well-developed.  HENT:     Head: Normocephalic and atraumatic.  Cardiovascular:     Rate and Rhythm: Normal rate and regular rhythm.  Pulmonary:     Effort: Pulmonary effort is normal. No respiratory distress.     Breath sounds: Normal breath sounds. No wheezing or rales.  Abdominal:     General: Bowel sounds are  normal. There is no distension.     Palpations: Abdomen is soft.     Tenderness: There is no abdominal tenderness.  Musculoskeletal:        General: Tenderness present.     Cervical back: Normal range of motion.     Comments: Right foot tenderness with corn plantar surface tender to touch  Skin:    General: Skin is warm and dry.  Neurological:     Mental Status: He is alert and oriented to person, place, and time.     Coordination: Coordination normal.     Vitals:   10/19/24 1021 10/19/24 1132  BP: (!) 150/90 (!) 140/90  Pulse: 71   Temp: 98.7 F (37.1 C)   TempSrc: Oral   SpO2: 95%   Weight: 260 lb (117.9 kg)   Height: 6' 2 (1.88 m)     Assessment & Plan:

## 2024-10-20 ENCOUNTER — Ambulatory Visit: Payer: Self-pay | Admitting: Internal Medicine

## 2024-10-26 ENCOUNTER — Ambulatory Visit: Admitting: Podiatry

## 2024-11-02 ENCOUNTER — Other Ambulatory Visit: Payer: Self-pay | Admitting: Emergency Medicine

## 2024-11-02 DIAGNOSIS — M79671 Pain in right foot: Secondary | ICD-10-CM

## 2024-11-15 ENCOUNTER — Other Ambulatory Visit: Payer: Self-pay | Admitting: Internal Medicine

## 2024-11-15 DIAGNOSIS — I10 Essential (primary) hypertension: Secondary | ICD-10-CM

## 2024-12-23 ENCOUNTER — Other Ambulatory Visit: Payer: Self-pay | Admitting: Internal Medicine

## 2024-12-23 NOTE — Telephone Encounter (Signed)
 Copied from CRM #[id number]. Topic: Clinical - Medication Refill >> Dec 23, 2024 10:44 AM Chasity T wrote: Medication: traMADol  (ULTRAM ) 50 MG tablet lisinopril -hydrochlorothiazide  (ZESTORETIC ) 20-25 MG tablet   Has the patient contacted their pharmacy? Yes   This is the patient's preferred pharmacy:  [id number] - [place] ([place]), [place] - [address] [place] ([place]) [place] [address] Phone: [phone] Fax: [phone]  Is this the correct pharmacy for this prescription? Yes If no, delete pharmacy and type the correct one.   Has the prescription been filled recently? No  Is the patient out of the medication? Yes  Has the patient been seen for an appointment in the last year OR does the patient have an upcoming appointment? Yes  Can we respond through MyChart? Yes  Agent: Please be advised that Rx refills may take up to 3 business days. We ask that you follow-up with your pharmacy.

## 2024-12-25 ENCOUNTER — Other Ambulatory Visit: Payer: Self-pay | Admitting: Internal Medicine

## 2024-12-28 MED ORDER — TRAMADOL HCL 50 MG PO TABS: 50.0000 mg | ORAL_TABLET | Freq: Two times a day (BID) | ORAL | 1 refills | Status: AC | PRN

## 2024-12-28 MED ORDER — LISINOPRIL-HYDROCHLOROTHIAZIDE 20-25 MG PO TABS: 1.0000 | ORAL_TABLET | Freq: Every day | ORAL | 3 refills | Status: AC
